# Patient Record
Sex: Female | Born: 1965 | Race: Black or African American | Hispanic: No | Marital: Married | Smoking: Never smoker
Health system: Southern US, Community
[De-identification: ages and names within clinical notes are randomized; demographics above are authoritative.]

## PROBLEM LIST (undated history)

## (undated) DIAGNOSIS — H6690 Otitis media, unspecified, unspecified ear: Secondary | ICD-10-CM

## (undated) DIAGNOSIS — F419 Anxiety disorder, unspecified: Secondary | ICD-10-CM

## (undated) DIAGNOSIS — R51 Headache: Secondary | ICD-10-CM

## (undated) DIAGNOSIS — E079 Disorder of thyroid, unspecified: Secondary | ICD-10-CM

## (undated) DIAGNOSIS — F410 Panic disorder [episodic paroxysmal anxiety] without agoraphobia: Secondary | ICD-10-CM

## (undated) DIAGNOSIS — N390 Urinary tract infection, site not specified: Secondary | ICD-10-CM

## (undated) DIAGNOSIS — E119 Type 2 diabetes mellitus without complications: Secondary | ICD-10-CM

## (undated) DIAGNOSIS — R519 Headache, unspecified: Secondary | ICD-10-CM

## (undated) HISTORY — PX: ABDOMINAL HYSTERECTOMY: SHX81

## (undated) HISTORY — PX: CHOLECYSTECTOMY: SHX55

## (undated) HISTORY — PX: OTHER SURGICAL HISTORY: SHX169

## (undated) HISTORY — PX: TUBAL LIGATION: SHX77

---

## 2004-08-22 HISTORY — PX: ABDOMINAL HYSTERECTOMY: SHX81

## 2008-11-24 DIAGNOSIS — J45909 Unspecified asthma, uncomplicated: Secondary | ICD-10-CM | POA: Insufficient documentation

## 2010-05-31 DIAGNOSIS — R002 Palpitations: Secondary | ICD-10-CM | POA: Insufficient documentation

## 2010-07-08 DIAGNOSIS — R109 Unspecified abdominal pain: Secondary | ICD-10-CM | POA: Insufficient documentation

## 2010-09-20 ENCOUNTER — Ambulatory Visit
Admission: RE | Admit: 2010-09-20 | Discharge: 2010-09-20 | Payer: Self-pay | Source: Home / Self Care | Admitting: Family Medicine

## 2010-09-29 NOTE — Assessment & Plan Note (Signed)
Summary: FEVER,SINUS PROBLEMS,CHEST TIGHTNESS,SOB,COUGH/WSE (rm 4)   Vital Signs:  Patient Profile:   45 Years Old Female CC:      sinus pressure and pain, cough, epistaxis, right chest discomfort Height:     63 inches Weight:      205 pounds O2 Sat:      99 % O2 treatment:    Room Air Temp:     98.3 degrees F oral Pulse rate:   76 / minute Resp:     16 per minute BP sitting:   126 / 84  (left arm) Cuff size:   large  Vitals Entered By: Lajean Saver RN (September 20, 2010 7:08 PM)                  Updated Prior Medication List: LEVOTHYROXINE SODIUM 175 MCG TABS (LEVOTHYROXINE SODIUM) once daily LORAZEPAM 0.5 MG TABS (LORAZEPAM) prn SERTRALINE HCL 50 MG TABS (SERTRALINE HCL)   Current Allergies: ! ELAVIL ! * LANSOPRAZOLE ! * MILKHistory of Present Illness Chief Complaint: sinus pressure and pain, cough, epistaxis, right chest discomfort History of Present Illness:  Subjective: Patient complains of onset URI symptoms 3 days ago with a sore throat (now resolved) followed by hoarseness and nasal congestion.  She had a sinusitis about two months ago and started taking Septra DS that she has left over.  She has a history of sinusitis about 4 times per year.  She has had intermittent right epistaxis. + mild cough today. No pleuritic pain No wheezing + post-nasal drainage + right side sinus pain/pressure No itchy/red eyes No earache but ears feel clogged No hemoptysis No SOB No fever, + chills No nausea No vomiting No abdominal pain No diarrhea No skin rashes + fatigue + myalgias + headache Used OTC meds without relief   REVIEW OF SYSTEMS Constitutional Symptoms      Denies fever, chills, night sweats, weight loss, weight gain, and fatigue.  Eyes       Denies change in vision, eye pain, eye discharge, glasses, contact lenses, and eye surgery. Ear/Nose/Throat/Mouth       Complains of frequent runny nose, frequent nose bleeds, sinus problems, and hoarseness.       Denies hearing loss/aids, change in hearing, ear pain, ear discharge, dizziness, sore throat, and tooth pain or bleeding.  Respiratory       Complains of productive cough.      Denies dry cough, wheezing, shortness of breath, asthma, bronchitis, and emphysema/COPD.  Cardiovascular       Complains of chest pain.      Denies murmurs and tires easily with exhertion.      Comments: right side   Gastrointestinal       Denies stomach pain, nausea/vomiting, diarrhea, constipation, blood in bowel movements, and indigestion. Genitourniary       Denies painful urination, kidney stones, and loss of urinary control. Neurological       Complains of headaches.      Denies paralysis, seizures, and fainting/blackouts. Musculoskeletal       Denies muscle pain, joint pain, joint stiffness, decreased range of motion, redness, swelling, muscle weakness, and gout.  Skin       Denies bruising, unusual mles/lumps or sores, and hair/skin or nail changes.  Psych       Denies mood changes, temper/anger issues, anxiety/stress, speech problems, depression, and sleep problems. Other Comments: X3days patient has had sinus pain and pressure, hoarseness, eye pain. 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  Extraocular movement is intact.  Conjunctivae are not inflamed.  Ears:  Canals normal.  Tympanic membranes normal.   Nose:  Congested turbinates.  No evidence epistaxis.  Right maxillary sinus tenderness present. Pharynx:  Normal  Neck:  Supple.   Slightly tender shotty anterior/posterior nodes are palpated bilaterally.  Lungs:  Clear to auscultation.  Breath sounds are equal.  Heart:  Regular rate and rhythm without murmurs, rubs, or gallops.  Abdomen:  Nontender without masses or hepatosplenomegaly.  Bowel sounds are present.  No CVA or flank tenderness.  Extremities:  No edema.   Skin:  No rash Assessment New Problems: ACUTE MAXILLARY SINUSITIS (ICD-461.0) UPPER RESPIRATORY INFECTION, ACUTE (ICD-465.9)   Plan New Medications/Changes: BENZONATATE 200 MG CAPS (BENZONATATE) One by mouth hs as needed cough  #12 x 0, 09/20/2010, Donna Christen MD FLUTICASONE PROPIONATE 50 MCG/ACT SUSP (FLUTICASONE PROPIONATE) 2 sprays in each nostril once daily  #One x 0, 09/20/2010, Donna Christen MD  New Orders: New Patient Level III [phone] Planning Comments:   Continue Septra DS for 7 to 10 days, expectorant/ decongestant, fluticasone nasal spray, cough suppressant at bedtime.  Increase fluid intake Followup with PCP if not improving 7 to 10 days   The patient and/or caregiver has been counseled thoroughly with regard to medications prescribed including dosage, schedule, interactions, rationale for use, and possible side effects and they verbalize understanding.  Diagnoses and expected course of recovery discussed and will return if not improved as expected or if the condition worsens. Patient and/or caregiver verbalized understanding.  Prescriptions: BENZONATATE 200 MG CAPS (BENZONATATE) One by mouth hs as needed cough  #12 x 0   Entered and Authorized by:   Donna Christen MD   Signed by:   Donna Christen MD on 09/20/2010   Method used:   Print then Give to Patient   RxID:   [id number] FLUTICASONE PROPIONATE 50 MCG/ACT SUSP (FLUTICASONE PROPIONATE) 2 sprays in each nostril once daily  #One x 0   Entered and Authorized by:   Donna Christen MD   Signed by:   Donna Christen MD on 09/20/2010   Method used:   Print then Give to Patient   RxID:    [id number]   Patient Instructions: 1)  Take Mucinex D (guaifenesin with decongestant) twice daily for congestion. 2)  Increase fluid intake, rest. 3)  May use Afrin nasal spray (or generic oxymetazoline) twice daily for about 5 days.  (Use the Afrin about 15 minutes before using prescription nose spray).  Also recommend using saline nasal spray several times daily and/or saline nasal irrigation. 4)  Finish antibiotic.   May use Vicks at night. 5)  Followup with family doctor if not improving 7 to 10 days.   Orders Added: 1)  New Patient Level III [13244]

## 2010-11-11 DIAGNOSIS — F411 Generalized anxiety disorder: Secondary | ICD-10-CM | POA: Insufficient documentation

## 2010-11-11 DIAGNOSIS — E039 Hypothyroidism, unspecified: Secondary | ICD-10-CM | POA: Insufficient documentation

## 2010-11-16 DIAGNOSIS — E559 Vitamin D deficiency, unspecified: Secondary | ICD-10-CM | POA: Insufficient documentation

## 2011-12-29 ENCOUNTER — Observation Stay (HOSPITAL_COMMUNITY): Payer: Self-pay

## 2011-12-29 ENCOUNTER — Observation Stay (HOSPITAL_COMMUNITY)
Admission: EM | Admit: 2011-12-29 | Discharge: 2011-12-29 | Disposition: A | Discharge status: Home / Self Care | Payer: Self-pay | Source: Ambulatory Visit | Attending: Emergency Medicine | Admitting: Emergency Medicine

## 2011-12-29 DIAGNOSIS — R209 Unspecified disturbances of skin sensation: Secondary | ICD-10-CM | POA: Insufficient documentation

## 2011-12-29 DIAGNOSIS — M519 Unspecified thoracic, thoracolumbar and lumbosacral intervertebral disc disorder: Secondary | ICD-10-CM

## 2011-12-29 DIAGNOSIS — R609 Edema, unspecified: Secondary | ICD-10-CM | POA: Insufficient documentation

## 2011-12-29 DIAGNOSIS — M25559 Pain in unspecified hip: Principal | ICD-10-CM | POA: Insufficient documentation

## 2011-12-29 DIAGNOSIS — M5126 Other intervertebral disc displacement, lumbar region: Secondary | ICD-10-CM | POA: Insufficient documentation

## 2011-12-29 DIAGNOSIS — M79609 Pain in unspecified limb: Secondary | ICD-10-CM | POA: Insufficient documentation

## 2011-12-29 LAB — BASIC METABOLIC PANEL
CO2: 28 mEq/L (ref 19–32)
Chloride: 100 mEq/L (ref 96–112)
Creatinine, Ser: 0.67 mg/dL (ref 0.50–1.10)
Potassium: 3.8 mEq/L (ref 3.5–5.1)

## 2011-12-29 LAB — CK: Total CK: 79 U/L (ref 7–177)

## 2011-12-29 MED ORDER — ACETAMINOPHEN 325 MG PO TABS
650.0000 mg | ORAL_TABLET | Freq: Once | ORAL | Status: AC
  Administered 2011-12-29: 650 mg via ORAL
  Filled 2011-12-29: qty 2

## 2011-12-29 NOTE — ED Notes (Signed)
Pt c/o right leg pain and left hip pain. Reporting right leg feels heavier than left, reporting swelling, although possible trace edema noted to right ankle. Pt describes leg pain as "shooting", pain extends from top of thigh to ankle. Pt ambulated to room without difficulty. Pt reports pain x 3 weeks. No injury noted. Skin intact. A & O x 4.

## 2011-12-29 NOTE — ED Notes (Signed)
Patient transported to MRI 

## 2011-12-29 NOTE — Discharge Instructions (Signed)
Degenerative Disc Disease Degenerative disc disease is a condition caused by the changes that occur in the cushions of the backbone (spinal discs) as you grow older. Spinal discs are soft and compressible discs located between the bones of the spine (vertebrae). They act like shock absorbers. Degenerative disc disease can affect the wholespine. However, the neck and lower back are most commonly affected. Many changes can occur in the spinal discs with aging, such as:  The spinal discs may dry and shrink.   Small tears may occur in the tough, outer covering of the disc (annulus).   The disc space may become smaller due to loss of water.   Abnormal growths in the bone (spurs) may occur. This can put pressure on the nerve roots exiting the spinal canal, causing pain.   The spinal canal may become narrowed.  CAUSES  Degenerative disc disease is a condition caused by the changes that occur in the spinal discs with aging. The exact cause is not known, but there is a genetic basis for many patients. Degenerative changes can occur due to loss of fluid in the disc. This makes the disc thinner and reduces the space between the backbones. Small cracks can develop in the outer layer of the disc. This can lead to the breakdown of the disc. You are more likely to get degenerative disc disease if you are overweight. Smoking cigarettes and doing heavy work such as weightlifting can also increase your risk of this condition. Degenerative changes can start after a sudden injury. Growth of bone spurs can compress the nerve roots and cause pain.  SYMPTOMS  The symptoms vary from person to person. Some people may have no pain, while others have severe pain. The pain may be so severe that it can limit your activities. The location of the pain depends on the part of your backbone that is affected. You will have neck or arm pain if a disc in the neck area is affected. You will have pain in your back, buttocks, or legs if a  disc in the lower back is affected. The pain becomes worse while bending, reaching up, or with twisting movements. The pain may start gradually and then get worse as time passes. It may also start after a major or minor injury. You may feel numbness or tingling in the arms or legs.  DIAGNOSIS  Your caregiver will ask you about your symptoms and about activities or habits that may cause the pain. He or she may also ask about any injuries, diseases, ortreatments you have had earlier. Your caregiver will examine you to check for the range of movement that is possible in the affected area, to check for strength in your extremities, and to check for sensation in the areas of the arms and legs supplied by different nerve roots. An X-ray of the spine may be taken. Your caregiver may suggest other imaging tests, such as a computerized magnetic scan (MRI), if needed.  TREATMENT  Treatment includes rest, modifying your activities, and applying ice and heat. Your caregiver may prescribe medicines to reduce your pain and may ask you to do some exercises to strengthen your back. In some cases, you may need surgery. You and your caregiver will decide on the treatment that is best for you. HOME CARE INSTRUCTIONS   Follow proper lifting and walking techniques as advised by your caregiver.   Maintain good posture.   Exercise regularly as advised.   Perform relaxation exercises.   Change your sitting,   standing, and sleeping habits as advised. Change positions frequently.   Lose weight as advised.   Stop smoking if you smoke.   Wear supportive footwear.  SEEK MEDICAL CARE IF:  The pain does not go away within 1 to 4 weeks. SEEK IMMEDIATE MEDICAL CARE IF:   The pain is severe.   You notice weakness in your arms, hands, or legs.   You begin to lose control of your bladder or bowel.  MAKE SURE YOU:   Understand these instructions.   Will watch your condition.   Will get help right away if you are not  doing well or get worse.  Document Released: 06/05/2007 Document Revised: 07/28/2011 Document Reviewed: 06/05/2007 [employer] Patient Information 2012 [employer]. 

## 2011-12-29 NOTE — ED Notes (Signed)
Pt complains of left leg and hip pain

## 2011-12-29 NOTE — ED Provider Notes (Signed)
History   This chart was scribed for Forbes Cellar, MD by Melba Coon. The patient was seen in room CDU03/CDU03 and the patient's care was started at 2:13PM.    CSN: [id number]  Arrival date & time 12/29/11  1317   None     Chief Complaint  Patient presents with  . Leg Pain  . Hip Pain  . Extremity Weakness    (Consider location/radiation/quality/duration/timing/severity/associated sxs/prior treatment) HPI Alexis Holt is a 46 y.o. female who presents to the Emergency Department complaining of constant, moderate to severe left hip and right leg pain with an onset 3 weeks ago. Pt used to have pains in both hip but now it is just the left hip. Tingling and numbness present in top of right leg and radiates to the ankle all the time; when she sleeps or sits down, it is worse. Leaning towards the left thip aggravates the pain. Pt has had this pain before; she is vitamin D deficient; pt takes vitamin D supplement. Pt states that she has right thigh edema present. Pt is still able to ambulate. Weakness in lower extremities present. No HA, fever, neck pain, sore throat, rash, back pain, CP, SOB, abd pain, n/v/d, dysuria. Hx of thyroid disease; takes levothyroxine. No family Hx of blood clots. PERC negative. No known allergies. No other pertinent medical symptoms.  No past medical history on file.  No past surgical history on file.  No family history on file.  History  Substance Use Topics  . Smoking status: Not on file  . Smokeless tobacco: Not on file  . Alcohol Use: Not on file    OB History    No data available      Review of Systems 10 Systems reviewed and all are negative for acute change except as noted in the HPI.   Allergies  Amitriptyline hcl; Pseudoephedrine; Sertraline; and Milk-related compounds  Home Medications   Current Outpatient Rx  Name Route Sig Dispense Refill  . VITAMIN D-3 5000 UNITS PO TABS Oral Take 5,000 Units by mouth daily.    Marland Kitchen LEVOTHYROXINE  SODIUM 150 MCG PO TABS Oral Take 150 mcg by mouth daily.    Marland Kitchen LORAZEPAM 1 MG PO TABS Oral Take 1 mg by mouth daily as needed. For anxiety    . THERA M PLUS PO TABS Oral Take 1 tablet by mouth daily.      BP 137/65  Pulse 100  Temp(Src) 98.1 F (36.7 C) (Oral)  Resp 18  SpO2 99%  Physical Exam  Nursing note and vitals reviewed. Constitutional: She is oriented to person, place, and time. She appears well-developed and well-nourished. No distress.  HENT:  Head: Normocephalic and atraumatic.  Eyes: EOM are normal. Pupils are equal, round, and reactive to light.  Neck: Normal range of motion. Neck supple. No tracheal deviation present.  Cardiovascular: Normal rate, regular rhythm and normal heart sounds.  Exam reveals no gallop and no friction rub.   No murmur heard. Pulmonary/Chest: Effort normal. No respiratory distress. She has no wheezes. She has no rales.  Abdominal: Soft. There is no tenderness.  Musculoskeletal: Normal range of motion. She exhibits edema (Trace pitting edema in bilateral lower extremities) and tenderness (Diffuse TTP anterior thigh).  Neurological: She is alert and oriented to person, place, and time.       4+/5 strength in rt hip flexors; 5/5 strength throughout LLE  Skin: Skin is warm and dry. No rash noted.  Psychiatric: She has a normal mood and affect.  Her behavior is normal.    ED Course  Procedures (including critical care time)  DIAGNOSTIC STUDIES: Oxygen Saturation is 99% on room air, normal by my interpretation.    COORDINATION OF CARE:  2:20PM - EDMD will order back MRI, blood w/u, lower extremities XRs and pain meds for the pt.    Labs Reviewed  BASIC METABOLIC PANEL  CK   No results found.   No diagnosis found.    MDM  Pt with multiple complaints including left hip pain, RLE weakness/numbness/tingling/edema. Left hip exam is benign. Weight bearing without difficulty. She does have objective leg weakness, min ttp. CK ordered r/o  myositis. MRI lumbar spine given weakness and paresthesias. I have very low susp for DVT. Declining pain medication at this time. Moved to CDU. D/W Felicie Morn, PA who will follow up MRI lumbar spine and dispo accordingly.  I personally performed the services described in this documentation, which was scribed in my presence. The recorded information has been reviewed and considered.        Forbes Cellar, MD 12/29/11 [phone]

## 2011-12-29 NOTE — ED Notes (Signed)
Onset 3 weeks ago pain left and right hip radiating to bilateral lower extremities currently pain left hip pain no radiating pain 5/10 achy sharp movement and rest no pain. Denies right hip pain however right lower extremity edema trace pedal pulse +2 bilateral and states right lower extremity feels heavier than left. Skin warm airway intact bilateral equal chest rise and fall. Ax4.

## 2012-01-06 ENCOUNTER — Encounter (HOSPITAL_COMMUNITY): Payer: Self-pay | Admitting: *Deleted

## 2012-01-06 ENCOUNTER — Emergency Department (HOSPITAL_COMMUNITY)
Admission: EM | Admit: 2012-01-06 | Discharge: 2012-01-07 | Disposition: A | Discharge status: Home / Self Care | Payer: Self-pay | Attending: Emergency Medicine | Admitting: Emergency Medicine

## 2012-01-06 DIAGNOSIS — R110 Nausea: Secondary | ICD-10-CM | POA: Insufficient documentation

## 2012-01-06 DIAGNOSIS — R109 Unspecified abdominal pain: Secondary | ICD-10-CM | POA: Insufficient documentation

## 2012-01-06 DIAGNOSIS — N390 Urinary tract infection, site not specified: Secondary | ICD-10-CM | POA: Insufficient documentation

## 2012-01-06 DIAGNOSIS — Z79899 Other long term (current) drug therapy: Secondary | ICD-10-CM | POA: Insufficient documentation

## 2012-01-06 LAB — POCT PREGNANCY, URINE: Preg Test, Ur: NEGATIVE

## 2012-01-06 NOTE — ED Notes (Signed)
Pt states that she was having abdominal pain since yesterday with some slight burning in her epigastric region as well. pt states that today she noticed cloudy and pressure after urination with frequent urination.

## 2012-01-07 LAB — URINALYSIS, ROUTINE W REFLEX MICROSCOPIC
Bilirubin Urine: NEGATIVE
Nitrite: NEGATIVE
Specific Gravity, Urine: 1.022 (ref 1.005–1.030)
Urobilinogen, UA: 0.2 mg/dL (ref 0.0–1.0)
pH: 5.5 (ref 5.0–8.0)

## 2012-01-07 LAB — CBC
HCT: 35.5 % — ABNORMAL LOW (ref 36.0–46.0)
MCH: 28.6 pg (ref 26.0–34.0)
MCHC: 34.4 g/dL (ref 30.0–36.0)
MCV: 83.3 fL (ref 78.0–100.0)
Platelets: 260 10*3/uL (ref 150–400)
RDW: 13 % (ref 11.5–15.5)
WBC: 10.5 10*3/uL (ref 4.0–10.5)

## 2012-01-07 LAB — DIFFERENTIAL
Basophils Absolute: 0 10*3/uL (ref 0.0–0.1)
Eosinophils Absolute: 0.2 10*3/uL (ref 0.0–0.7)
Eosinophils Relative: 2 % (ref 0–5)
Monocytes Absolute: 0.9 10*3/uL (ref 0.1–1.0)

## 2012-01-07 LAB — COMPREHENSIVE METABOLIC PANEL
ALT: 13 U/L (ref 0–35)
AST: 16 U/L (ref 0–37)
CO2: 24 mEq/L (ref 19–32)
Calcium: 8.9 mg/dL (ref 8.4–10.5)
Creatinine, Ser: 0.71 mg/dL (ref 0.50–1.10)
GFR calc Af Amer: >90 mL/min (ref >90)
GFR calc non Af Amer: >90 mL/min (ref >90)
Sodium: 136 mEq/L (ref 135–145)
Total Protein: 7 g/dL (ref 6.0–8.3)

## 2012-01-07 LAB — URINE MICROSCOPIC-ADD ON

## 2012-01-07 MED ORDER — CIPROFLOXACIN HCL 500 MG PO TABS: 500.0000 mg | ORAL_TABLET | Freq: Two times a day (BID) | ORAL | Status: AC

## 2012-01-07 MED ORDER — HYDROCODONE-ACETAMINOPHEN 5-500 MG PO TABS: 1.0000 | ORAL_TABLET | Freq: Four times a day (QID) | ORAL | Status: AC | PRN

## 2012-01-07 MED ORDER — GI COCKTAIL ~~LOC~~
30.0000 mL | Freq: Once | ORAL | Status: AC
  Administered 2012-01-07: 30 mL via ORAL
  Filled 2012-01-07: qty 30

## 2012-01-07 NOTE — Discharge Instructions (Signed)
Abdominal Pain Abdominal pain can be caused by many things. Your caregiver decides the seriousness of your pain by an examination and possibly blood tests and X-rays. Many cases can be observed and treated at home. Most abdominal pain is not caused by a disease and will probably improve without treatment. However, in many cases, more time must pass before a clear cause of the pain can be found. Before that point, it may not be known if you need more testing, or if hospitalization or surgery is needed. HOME CARE INSTRUCTIONS   Do not take laxatives unless directed by your caregiver.   Take pain medicine only as directed by your caregiver.   Only take over-the-counter or prescription medicines for pain, discomfort, or fever as directed by your caregiver.   Try a clear liquid diet (broth, tea, or water) for as long as directed by your caregiver. Slowly move to a bland diet as tolerated.  SEEK IMMEDIATE MEDICAL CARE IF:   The pain does not go away.   You have a fever.   You keep throwing up (vomiting).   The pain is felt only in portions of the abdomen. Pain in the right side could possibly be appendicitis. In an adult, pain in the left lower portion of the abdomen could be colitis or diverticulitis.   You pass bloody or black tarry stools.  MAKE SURE YOU:   Understand these instructions.   Will watch your condition.   Will get help right away if you are not doing well or get worse.  Document Released: 05/18/2005 Document Revised: 07/28/2011 Document Reviewed: 03/26/2008 [facility] Patient Information 2012 [place], [place].Urinary Tract Infection A urinary tract infection (UTI) is often caused by a germ (bacteria). A UTI is usually helped with medicine (antibiotics) that kills germs. Take all the medicine until it is gone. Do this even if you are feeling better. You are usually better in 7 to 10 days. HOME CARE   Drink enough water and fluids to keep your pee (urine) clear or pale yellow.  Drink:   Cranberry juice.   Water.   Avoid:   Caffeine.   Tea.   Bubbly (carbonated) drinks.   Alcohol.   Only take medicine as told by your doctor.   To prevent further infections:   Pee often.   After pooping (bowel movement), women should wipe from front to back. Use each tissue only once.   Pee before and after having sex (intercourse).  Ask your doctor when your test results will be ready. Make sure you follow up and get your test results.  GET HELP RIGHT AWAY IF:   There is very bad back pain or lower belly (abdominal) pain.   You get the chills.   You have a fever.   Your baby is older than 3 months with a rectal temperature of 102 F (38.9 C) or higher.   Your baby is 51 months old or younger with a rectal temperature of 100.4 F (38 C) or higher.   You feel sick to your stomach (nauseous) or throw up (vomit).   There is continued burning with peeing.   Your problems are not better in 3 days. Return sooner if you are getting worse.  MAKE SURE YOU:   Understand these instructions.   Will watch your condition.   Will get help right away if you are not doing well or get worse.  Document Released: 01/25/2008 Document Revised: 07/28/2011 Document Reviewed: 01/25/2008 [facility] Patient Information 2012 [place], [place].

## 2012-01-07 NOTE — ED Provider Notes (Signed)
History     CSN: [id number]  Arrival date & time 01/06/12  2314   First MD Initiated Contact with Patient 01/06/12 2351      Chief Complaint  Patient presents with  . Abdominal Pain    (Consider location/radiation/quality/duration/timing/severity/associated sxs/prior treatment) HPI Comments: History of lap chole in the past.  Patient is a 46 y.o. female presenting with abdominal pain. The history is provided by the patient.  Abdominal Pain The primary symptoms of the illness include abdominal pain, nausea and dysuria. The primary symptoms of the illness do not include fever, vomiting or diarrhea. The onset of the illness was gradual. The problem has been gradually worsening.  The patient states that she believes she is currently not pregnant. The patient has not had a change in bowel habit. Symptoms associated with the illness do not include constipation or back pain.    History reviewed. No pertinent past medical history.  Past Surgical History  Procedure Date  . Thyriod     surgery  . Cholecystectomy   . Tubal ligation     History reviewed. No pertinent family history.  History  Substance Use Topics  . Smoking status: Never Smoker   . Smokeless tobacco: Not on file  . Alcohol Use: No    OB History    Grav Para Term Preterm Abortions TAB SAB Ect Mult Living                  Review of Systems  Constitutional: Negative for fever.  Gastrointestinal: Positive for nausea and abdominal pain. Negative for vomiting, diarrhea and constipation.  Genitourinary: Positive for dysuria.  Musculoskeletal: Negative for back pain.  All other systems reviewed and are negative.    Allergies  Amitriptyline hcl; Pseudoephedrine; Sertraline; and Milk-related compounds  Home Medications   Current Outpatient Rx  Name Route Sig Dispense Refill  . AL HYD-MG TR-ALG AC-SOD BICARB PO CHEW Oral Chew 1-2 tablets by mouth 3 (three) times daily as needed. For gas pain    . VITAMIN D-3  5000 UNITS PO TABS Oral Take 5,000 Units by mouth daily.    . IBUPROFEN 200 MG PO TABS Oral Take 200 mg by mouth every 6 (six) hours as needed. For inflammation    . LEVOTHYROXINE SODIUM 150 MCG PO TABS Oral Take 150 mcg by mouth daily.    Marland Kitchen LORAZEPAM 1 MG PO TABS Oral Take 0.5-1 mg by mouth daily as needed. For anxiety    . THERA M PLUS PO TABS Oral Take 1 tablet by mouth daily.      BP 135/71  Pulse 84  Temp(Src) 98.6 F (37 C) (Oral)  Resp 16  SpO2 97%  Physical Exam  Nursing note and vitals reviewed. Constitutional: She is oriented to person, place, and time. She appears well-developed and well-nourished. No distress.  HENT:  Head: Normocephalic and atraumatic.  Neck: Normal range of motion. Neck supple.  Cardiovascular: Normal rate and regular rhythm.   No murmur heard. Pulmonary/Chest: Effort normal and breath sounds normal. No respiratory distress.  Abdominal: Soft. Bowel sounds are normal. She exhibits no distension. There is no tenderness.  Musculoskeletal: Normal range of motion. She exhibits no edema.  Neurological: She is alert and oriented to person, place, and time.  Skin: Skin is warm and dry. She is not diaphoretic.    ED Course  Procedures (including critical care time)  Labs Reviewed  URINALYSIS, ROUTINE W REFLEX MICROSCOPIC - Abnormal; Notable for the following:    APPearance  CLOUDY (*)    Hgb urine dipstick LARGE (*)    Ketones, ur 15 (*)    Protein, ur 100 (*)    Leukocytes, UA MODERATE (*)    All other components within normal limits  URINE MICROSCOPIC-ADD ON - Abnormal; Notable for the following:    Bacteria, UA FEW (*)    All other components within normal limits  POCT PREGNANCY, URINE  CBC  DIFFERENTIAL  COMPREHENSIVE METABOLIC PANEL  LIPASE, BLOOD   No results found.   No diagnosis found.    MDM  The labs look okay, however the urine shows a uti.  Will treat with cipro, pain meds.  To return prn if worsens.        Geoffery Lyons, MD 01/07/12 0111

## 2012-01-26 ENCOUNTER — Encounter (HOSPITAL_COMMUNITY): Payer: Self-pay | Admitting: Emergency Medicine

## 2012-01-26 ENCOUNTER — Emergency Department (INDEPENDENT_AMBULATORY_CARE_PROVIDER_SITE_OTHER)
Admission: EM | Admit: 2012-01-26 | Discharge: 2012-01-26 | Disposition: A | Discharge status: Home / Self Care | Payer: Self-pay | Source: Home / Self Care | Attending: Emergency Medicine | Admitting: Emergency Medicine

## 2012-01-26 DIAGNOSIS — F411 Generalized anxiety disorder: Secondary | ICD-10-CM

## 2012-01-26 DIAGNOSIS — Z760 Encounter for issue of repeat prescription: Secondary | ICD-10-CM

## 2012-01-26 DIAGNOSIS — E039 Hypothyroidism, unspecified: Secondary | ICD-10-CM

## 2012-01-26 DIAGNOSIS — F419 Anxiety disorder, unspecified: Secondary | ICD-10-CM

## 2012-01-26 HISTORY — DX: Disorder of thyroid, unspecified: E07.9

## 2012-01-26 HISTORY — DX: Anxiety disorder, unspecified: F41.9

## 2012-01-26 HISTORY — DX: Panic disorder (episodic paroxysmal anxiety): F41.0

## 2012-01-26 LAB — TSH: TSH: 0.019 u[IU]/mL — ABNORMAL LOW (ref 0.350–4.500)

## 2012-01-26 LAB — POCT I-STAT, CHEM 8
BUN: 8 mg/dL (ref 6–23)
Creatinine, Ser: 0.6 mg/dL (ref 0.50–1.10)
Hemoglobin: 13.3 g/dL (ref 12.0–15.0)
Potassium: 3.5 mEq/L (ref 3.5–5.1)
Sodium: 141 mEq/L (ref 135–145)
TCO2: 27 mmol/L (ref 0–100)

## 2012-01-26 MED ORDER — BUSPIRONE HCL 10 MG PO TABS: 10.0000 mg | ORAL_TABLET | Freq: Two times a day (BID) | ORAL | Status: DC

## 2012-01-26 MED ORDER — LEVOTHYROXINE SODIUM 175 MCG PO TABS: ORAL_TABLET | ORAL | Status: DC

## 2012-01-26 NOTE — Discharge Instructions (Signed)
Anxiety and Panic Attacks Your caregiver has informed you that you are having an anxiety or panic attack. There may be many forms of this. Most of the time these attacks come suddenly and without warning. They come at any time of day, including periods of sleep, and at any time of life. They may be strong and unexplained. Although panic attacks are very scary, they are physically harmless. Sometimes the cause of your anxiety is not known. Anxiety is a protective mechanism of the body in its fight or flight mechanism. Most of these perceived danger situations are actually nonphysical situations (such as anxiety over losing a job). CAUSES  The causes of an anxiety or panic attack are many. Panic attacks may occur in otherwise healthy people given a certain set of circumstances. There may be a genetic cause for panic attacks. Some medications may also have anxiety as a side effect. SYMPTOMS  Some of the most common feelings are:  Intense terror.   Dizziness, feeling faint.   Hot and cold flashes.   Fear of going crazy.   Feelings that nothing is real.   Sweating.   Shaking.   Chest pain or a fast heartbeat (palpitations).   Smothering, choking sensations.   Feelings of impending doom and that death is near.   Tingling of extremities, this may be from over-breathing.   Altered reality (derealization).   Being detached from yourself (depersonalization).  Several symptoms can be present to make up anxiety or panic attacks. DIAGNOSIS  The evaluation by your caregiver will depend on the type of symptoms you are experiencing. The diagnosis of anxiety or panic attack is made when no physical illness can be determined to be a cause of the symptoms. TREATMENT  Treatment to prevent anxiety and panic attacks may include:  Avoidance of circumstances that cause anxiety.   Reassurance and relaxation.   Regular exercise.   Relaxation therapies, such as yoga.   Psychotherapy with a  psychiatrist or therapist.   Avoidance of caffeine, alcohol and illegal drugs.   Prescribed medication.  SEEK IMMEDIATE MEDICAL CARE IF:   You experience panic attack symptoms that are different than your usual symptoms.   You have any worsening or concerning symptoms.  Document Released: 08/08/2005 Document Revised: 07/28/2011 Document Reviewed: 12/10/2009 [facility] Patient Information 2012 [place], [place].Hypothyroidism The thyroid is a large gland located in the lower front of your neck. The thyroid gland helps control metabolism. Metabolism is how your body handles food. It controls metabolism with the hormone thyroxine. When this gland is underactive (hypothyroid), it produces too little hormone.  CAUSES These include:   Absence or destruction of thyroid tissue.   Goiter due to iodine deficiency.   Goiter due to medications.   Congenital defects (since birth).   Problems with the pituitary. This causes a lack of TSH (thyroid stimulating hormone). This hormone tells the thyroid to turn out more hormone.  SYMPTOMS  Lethargy (feeling as though you have no energy)   Cold intolerance   Weight gain (in spite of normal food intake)   Dry skin   Coarse hair   Menstrual irregularity (if severe, may lead to infertility)   Slowing of thought processes  Cardiac problems are also caused by insufficient amounts of thyroid hormone. Hypothyroidism in the newborn is cretinism, and is an extreme form. It is important that this form be treated adequately and immediately or it will lead rapidly to retarded physical and mental development. DIAGNOSIS  To prove hypothyroidism, your caregiver may  do blood tests and ultrasound tests. Sometimes the signs are hidden. It may be necessary for your caregiver to watch this illness with blood tests either before or after diagnosis and treatment. TREATMENT  Low levels of thyroid hormone are increased by using synthetic thyroid hormone. This is a safe,  effective treatment. It usually takes about four weeks to gain the full effects of the medication. After you have the full effect of the medication, it will generally take another four weeks for problems to leave. Your caregiver may start you on low doses. If you have had heart problems the dose may be gradually increased. It is generally not an emergency to get rapidly to normal. HOME CARE INSTRUCTIONS   Take your medications as your caregiver suggests. Let your caregiver know of any medications you are taking or start taking. Your caregiver will help you with dosage schedules.   As your condition improves, your dosage needs may increase. It will be necessary to have continuing blood tests as suggested by your caregiver.   Report all suspected medication side effects to your caregiver.  SEEK MEDICAL CARE IF: Seek medical care if you develop:  Sweating.   Tremulousness (tremors).   Anxiety.   Rapid weight loss.   Heat intolerance.   Emotional swings.   Diarrhea.   Weakness.  SEEK IMMEDIATE MEDICAL CARE IF:  You develop chest pain, an irregular heart beat (palpitations), or a rapid heart beat. MAKE SURE YOU:   Understand these instructions.   Will watch your condition.   Will get help right away if you are not doing well or get worse.  Document Released: 08/08/2005 Document Revised: 07/28/2011 Document Reviewed: 03/28/2008 [facility] Patient Information 2012 [place], [place].

## 2012-01-26 NOTE — ED Provider Notes (Addendum)
History     CSN: [id number]  Arrival date & time 01/26/12  1108   First MD Initiated Contact with Patient 01/26/12 1108      Chief Complaint  Patient presents with  . Medication Refill    (Consider location/radiation/quality/duration/timing/severity/associated sxs/prior treatment) HPI Comments: Presents urgent care today describing that her PCP in [place] is unable to refill her thyroid prescription as she has not been able to go back to have labs redrawn. She has moved back to [place] within the last 2 or 3 months and is requesting a medication refill for her thyroid. She also seemed to be experiencing anxiety related symptoms as she has been going through a lot of family issues and stressors. She describes it in the past she has been treated for anxiety and panic attacks. Will like [place] to help her, with these symptoms.   Past Medical History  Diagnosis Date  . Anxiety   . Panic attacks   . Thyroid disease     Past Surgical History  Procedure Date  . Thyriod     surgery  . Cholecystectomy   . Tubal ligation   . Abdominal hysterectomy     No family history on file.  History  Substance Use Topics  . Smoking status: Never Smoker   . Smokeless tobacco: Not on file  . Alcohol Use: No    OB History    Grav Para Term Preterm Abortions TAB SAB Ect Mult Living                  Review of Systems  Constitutional: Positive for appetite change. Negative for fever, chills, fatigue and unexpected weight change.  Neurological: Negative for dizziness.    Allergies  Amitriptyline hcl; Pseudoephedrine; Sertraline; and Milk-related compounds  Home Medications   Current Outpatient Rx  Name Route Sig Dispense Refill  . VITAMIN D-3 5000 UNITS PO TABS Oral Take 5,000 Units by mouth daily.    Marland Kitchen LEVOTHYROXINE SODIUM 150 MCG PO TABS Oral Take 150 mcg by mouth daily.    Marland Kitchen LORAZEPAM 1 MG PO TABS Oral Take 0.5-1 mg by mouth daily as needed. For anxiety    . THERA M PLUS PO TABS  Oral Take 1 tablet by mouth daily.    . AL HYD-MG TR-ALG AC-SOD BICARB PO CHEW Oral Chew 1-2 tablets by mouth 3 (three) times daily as needed. For gas pain    . BUSPIRONE HCL 10 MG PO TABS Oral Take 1 tablet (10 mg total) by mouth 2 (two) times daily before a meal. 30 tablet 0  . IBUPROFEN 200 MG PO TABS Oral Take 200 mg by mouth every 6 (six) hours as needed. For inflammation    . LEVOTHYROXINE SODIUM 175 MCG PO TABS  take 1 tablet daily 60 tablet 0    BP 136/87  Pulse 90  Temp(Src) 97.8 F (36.6 C) (Oral)  Resp 20  SpO2 99%  Physical Exam  Nursing note and vitals reviewed. Constitutional: She is oriented to person, place, and time. She appears well-developed and well-nourished.  HENT:  Head: Normocephalic.  Eyes: Conjunctivae are normal.  Neck: Neck supple. No JVD present. No thyromegaly present.  Lymphadenopathy:    She has no cervical adenopathy.  Neurological: She is alert and oriented to person, place, and time.  Skin: Skin is warm. No erythema.  Psychiatric: Her speech is normal and behavior is normal. Judgment and thought content normal. Her mood appears anxious. Her affect is not angry, not labile and  not inappropriate. Cognition and memory are normal.    ED Course  Procedures (including critical care time)   Labs Reviewed  TSH   No results found.   1. Medication refill   2. Hypothyroidism   3. Anxiety    Today we have drawn baseline i-STAT and a TSH level. Patient will be contacted if abnormal test results.   MDM  Patient has appointment with a local primary care Dr. in mid June. Requesting medication refills for her thyroid as well as she is expressing anxiety related symptoms and has a history of panic attacks. She's also requesting help with her anxiety related symptoms.  We also started patient on BuSpar encourage her to followup with our behavioral clinic within the next 2-4 weeks. For further treatment. Patient agreed with treatment plan and followup  care with both her primary care Dr. Ellis Parents) and a behavioral specialist.        Jimmie Molly, MD 01/26/12 1236  Jimmie Molly, MD 01/26/12 2122

## 2012-01-26 NOTE — ED Notes (Signed)
Patient reports her pcp in [place].  Patient has recently moved back to [place] 1-54-months ago.  Needs refill of thyroid medicine, pcp will not without labs being checked.  Patient is also concerned about panic attacks, has also been diagnosed with panic attacks in the past.  Requesting vitamin d level to be checked.

## 2012-01-26 NOTE — ED Notes (Signed)
Discharge delay secondary to blood draw

## 2012-01-26 NOTE — ED Notes (Signed)
Patient will have insurance go into effect this month.

## 2012-01-30 ENCOUNTER — Telehealth (HOSPITAL_COMMUNITY): Payer: Self-pay | Admitting: *Deleted

## 2012-01-30 NOTE — ED Notes (Signed)
Pt notified TSH level is low at 0.019.  Instructed to follow up with her PCP for monitoring and titration of Synthroid per Dr. Ladon Applebaum.  Pt verbalized understanding.

## 2012-03-10 ENCOUNTER — Encounter (HOSPITAL_COMMUNITY): Payer: Self-pay | Admitting: *Deleted

## 2012-03-10 ENCOUNTER — Emergency Department (HOSPITAL_COMMUNITY)
Admission: EM | Admit: 2012-03-10 | Discharge: 2012-03-10 | Disposition: A | Discharge status: Home / Self Care | Payer: Commercial Managed Care - PPO | Attending: Emergency Medicine | Admitting: Emergency Medicine

## 2012-03-10 DIAGNOSIS — M79609 Pain in unspecified limb: Secondary | ICD-10-CM | POA: Insufficient documentation

## 2012-03-10 DIAGNOSIS — E079 Disorder of thyroid, unspecified: Secondary | ICD-10-CM | POA: Insufficient documentation

## 2012-03-10 DIAGNOSIS — M79606 Pain in leg, unspecified: Secondary | ICD-10-CM

## 2012-03-10 LAB — POCT I-STAT, CHEM 8
Chloride: 102 mEq/L (ref 96–112)
HCT: 38 % (ref 36.0–46.0)
Potassium: 3.7 mEq/L (ref 3.5–5.1)

## 2012-03-10 LAB — CK: Total CK: 96 U/L (ref 7–177)

## 2012-03-10 MED ORDER — OXYCODONE-ACETAMINOPHEN 5-325 MG PO TABS: 1.0000 | ORAL_TABLET | ORAL | Status: DC | PRN

## 2012-03-10 NOTE — ED Notes (Signed)
Pt reports he pain bilaterally and feels like too much sensation; cannot tolerate blanket or too much touch on leg. She reports is also feels like they are swollen but they are not. Pt has tried to elevate with no relief. Last time she said she had this problem, she had a decrease in vitamin D.

## 2012-03-10 NOTE — ED Notes (Signed)
Pt c/o bilateral lower extremity pain x 2 mos. States she was treated in ED 2 mos ago for same, at which time [place] was negative. Pt reports no relief or change in pain since that time. States pain is slightly less with activity. Pt resting in a position of comfort at this time. VSS. Denies further needs

## 2012-03-10 NOTE — ED Notes (Signed)
The pt has had leg pain bi-lateral  For 2 months.  The tops of her thighs and her shinns.  She saw her doctor earlier today for other problems

## 2012-03-10 NOTE — ED Provider Notes (Signed)
History     CSN: [id number]  Arrival date & time 03/10/12  0222   First MD Initiated Contact with Patient 03/10/12 [phone]      Chief Complaint  Patient presents with  . Leg Pain     Patient is a 46 y.o. female presenting with leg pain. The history is provided by the patient.  Leg Pain  The incident occurred more than 1 week ago. There was no injury mechanism. Pain location: bilateral thights and tibial surface. The pain is mild. The pain has been constant since onset. Pertinent negatives include no numbness, no inability to bear weight, no loss of motion and no loss of sensation. Exacerbated by: rest. She has tried rest for the symptoms. The treatment provided no relief.  pt presents for bilateral leg pain for at least two months She reports pain in thighs/anterior tibial surfaces for at least 2 months.   No trauma She can ambulate No fever/cp/sob No radiation of pain from her back No erythema to legs No known h/o DVT, no recent travel Saw PCP for ear infection today but did not mention leg pain Denies recent excessive work or overexertion or heat exposure Past Medical History  Diagnosis Date  . Anxiety   . Panic attacks   . Thyroid disease     Past Surgical History  Procedure Date  . Thyriod     surgery  . Cholecystectomy   . Tubal ligation   . Abdominal hysterectomy     No family history on file.  History  Substance Use Topics  . Smoking status: Never Smoker   . Smokeless tobacco: Not on file  . Alcohol Use: No    OB History    Grav Para Term Preterm Abortions TAB SAB Ect Mult Living                  Review of Systems  Constitutional: Negative for fever.  Respiratory: Negative for shortness of breath.   Cardiovascular: Negative for chest pain.  Musculoskeletal: Negative for joint swelling.  Neurological: Negative for numbness.  All other systems reviewed and are negative.    Allergies  Amitriptyline hcl; Pseudoephedrine; Sertraline; and  Milk-related compounds  Home Medications   Current Outpatient Rx  Name Route Sig Dispense Refill  . ANTIPYRINE-BENZOCAINE 5.4-1.4 % OT SOLN Both Ears Place 4 drops into both ears 2 (two) times daily.    . BUSPIRONE HCL 10 MG PO TABS Oral Take 1 tablet (10 mg total) by mouth 2 (two) times daily before a meal. 30 tablet 0  . VITAMIN D-3 5000 UNITS PO TABS Oral Take 5,000 Units by mouth daily.    Marland Kitchen LEVOFLOXACIN 750 MG PO TABS Oral Take 750 mg by mouth daily.    Marland Kitchen LEVOTHYROXINE SODIUM 175 MCG PO TABS  take 1 tablet daily 60 tablet 0  . THERA M PLUS PO TABS Oral Take 1 tablet by mouth daily.    Marland Kitchen PANTOPRAZOLE SODIUM 40 MG PO TBEC Oral Take 40 mg by mouth daily.      BP 122/73  Pulse 81  Temp 97.9 F (36.6 C) (Oral)  Resp 16  SpO2 100%  Physical Exam CONSTITUTIONAL: Well developed/well nourished HEAD AND FACE: Normocephalic/atraumatic EYES: EOMI/PERRL ENMT: Mucous membranes moist NECK: supple no meningeal signs SPINE:entire spine nontender CV: S1/S2 noted, no murmurs/rubs/gallops noted LUNGS: Lungs are clear to auscultation bilaterally, no apparent distress ABDOMEN: soft, nontender, no rebound or guarding GU:no cva tenderness NEURO: Pt is awake/alert, moves all extremitiesx4 EXTREMITIES: pulses  normal, full ROM. No edema, no erythema.  No calf tenderness.  Legs are symmetric.  She has full ROM of each knee/ankle.  No bruising noted.   SKIN: warm, color normal PSYCH: no abnormalities of mood noted   ED Course  Procedures  Labs Reviewed  POCT I-STAT, CHEM 8 - Abnormal; Notable for the following:    Glucose, Bld 112 (*)     All other components within normal limits  CK    Labs reassuring Stable for d/c  MDM  Nursing notes including past medical history and social history reviewed and considered in documentation All labs/vitals reviewed and considered         Joya Gaskins, MD 03/10/12 [phone]

## 2012-03-15 ENCOUNTER — Encounter (HOSPITAL_COMMUNITY): Payer: Self-pay | Admitting: *Deleted

## 2012-03-15 ENCOUNTER — Emergency Department (HOSPITAL_COMMUNITY)
Admission: EM | Admit: 2012-03-15 | Discharge: 2012-03-16 | Disposition: A | Discharge status: Home / Self Care | Payer: Commercial Managed Care - PPO | Attending: Emergency Medicine | Admitting: Emergency Medicine

## 2012-03-15 DIAGNOSIS — J329 Chronic sinusitis, unspecified: Secondary | ICD-10-CM

## 2012-03-15 DIAGNOSIS — F410 Panic disorder [episodic paroxysmal anxiety] without agoraphobia: Secondary | ICD-10-CM | POA: Insufficient documentation

## 2012-03-15 DIAGNOSIS — E079 Disorder of thyroid, unspecified: Secondary | ICD-10-CM | POA: Insufficient documentation

## 2012-03-15 HISTORY — DX: Otitis media, unspecified, unspecified ear: H66.90

## 2012-03-15 HISTORY — DX: Urinary tract infection, site not specified: N39.0

## 2012-03-15 LAB — URINALYSIS, ROUTINE W REFLEX MICROSCOPIC
Leukocytes, UA: NEGATIVE
Nitrite: NEGATIVE
Specific Gravity, Urine: 1.021 (ref 1.005–1.030)
pH: 7 (ref 5.0–8.0)

## 2012-03-15 NOTE — ED Notes (Signed)
Pt was tx here on Fri for complications of dizziness r/t chronic ear infection (1 month) and a uti.  She was d/c'd with a prescription for prednisone which she did not take b/c she did not feel it would help her dizziness.  The symptoms increased (R facial pain, eye pain, bil ear pain, dizziness).  Pt called her doctors office today and they stated to return to the ED.

## 2012-03-16 MED ORDER — PHENYLEPHRINE HCL 1 % NA SOLN: 1.0000 [drp] | Freq: Once | NASAL | Status: AC

## 2012-03-16 NOTE — ED Provider Notes (Signed)
History     CSN: [id number]  Arrival date & time 03/15/12  2055   First MD Initiated Contact with Patient 03/15/12 2355      Chief Complaint  Patient presents with  . Dizziness  . Facial Pain  . Sore Throat    (Consider location/radiation/quality/duration/timing/severity/associated sxs/prior treatment) Patient is a 46 y.o. female presenting with pharyngitis. The history is provided by the patient.  Sore Throat This is a recurrent problem. Associated symptoms include abdominal pain and headaches. Pertinent negatives include no chest pain.   patient has had nasal congestion and facial pain with ear pain for the last week. She's also been seen for dizziness related to ear infections. She's currently on antibiotics. She's had a week of levofloxacin. No fevers. No cough. She does have little bit of sore throat. No trouble hearing. She has episodes of lightheadedness. She has a headache that will come and go. No localizing numbness or weakness. She was also recently treated for urinary tract infection. She's had some upper abdominal pain also. No trouble hearing. She states sometimes she'll have some vision trouble with the right eye. Patient is concerned she may be having a stroke.  Past Medical History  Diagnosis Date  . Anxiety   . Panic attacks   . Thyroid disease   . Chronic ear infection   . UTI (lower urinary tract infection)     Past Surgical History  Procedure Date  . Thyriod     surgery  . Cholecystectomy   . Tubal ligation   . Abdominal hysterectomy     No family history on file.  History  Substance Use Topics  . Smoking status: Never Smoker   . Smokeless tobacco: Not on file  . Alcohol Use: No    OB History    Grav Para Term Preterm Abortions TAB SAB Ect Mult Living                  Review of Systems  Constitutional: Positive for fatigue. Negative for chills.  HENT: Positive for ear pain, congestion, sore throat, rhinorrhea and sinus pressure. Negative  for hearing loss, drooling, neck pain and tinnitus.   Respiratory: Negative for cough.   Cardiovascular: Negative for chest pain.  Gastrointestinal: Positive for abdominal pain.  Genitourinary: Negative for hematuria.  Musculoskeletal: Negative for back pain.  Neurological: Positive for headaches. Negative for light-headedness and numbness.  Hematological: Negative for adenopathy.  Psychiatric/Behavioral: Negative for agitation.    Allergies  Amitriptyline hcl; Pseudoephedrine; Sertraline; and Milk-related compounds  Home Medications   Current Outpatient Rx  Name Route Sig Dispense Refill  . ANTIPYRINE-BENZOCAINE 5.4-1.4 % OT SOLN Both Ears Place 4 drops into both ears 2 (two) times daily.    . BUSPIRONE HCL 10 MG PO TABS Oral Take 5 mg by mouth 2 (two) times daily.    Marland Kitchen VITAMIN D-3 5000 UNITS PO TABS Oral Take 5,000 Units by mouth 2 (two) times a week.     Marland Kitchen LEVOFLOXACIN 750 MG PO TABS Oral Take 750 mg by mouth daily.    Marland Kitchen LEVOTHYROXINE SODIUM 137 MCG PO TABS Oral Take 137 mcg by mouth daily.    Carma Leaven M PLUS PO TABS Oral Take 1 tablet by mouth daily.    Marland Kitchen PANTOPRAZOLE SODIUM 40 MG PO TBEC Oral Take 40 mg by mouth every morning.     Marland Kitchen PHENYLEPHRINE HCL 1 % NA SOLN Nasal Place 1 drop into the nose once. 30 mL 0    BP  123/80  Pulse 72  Temp 98.4 F (36.9 C) (Oral)  Resp 17  SpO2 100%  Physical Exam  Nursing note and vitals reviewed. Constitutional: She is oriented to person, place, and time. She appears well-developed and well-nourished.  HENT:  Head: Normocephalic and atraumatic.       Tenderness over right maxillary sinus. Bilateral TMs possible effusions. Minimal erythema.  Eyes: EOM are normal. Pupils are equal, round, and reactive to light.  Neck: Normal range of motion. Neck supple.  Cardiovascular: Normal rate, regular rhythm and normal heart sounds.   No murmur heard. Pulmonary/Chest: Effort normal and breath sounds normal. No respiratory distress. She has no  wheezes. She has no rales.  Abdominal: Soft. Bowel sounds are normal. She exhibits no distension. There is no tenderness. There is no rebound and no guarding.  Musculoskeletal: Normal range of motion.  Neurological: She is alert and oriented to person, place, and time. No cranial nerve deficit.  Skin: Skin is warm and dry.  Psychiatric: She has a normal mood and affect. Her speech is normal.    ED Course  Procedures (including critical care time)   Labs Reviewed  URINALYSIS, ROUTINE W REFLEX MICROSCOPIC   No results found.   1. Sinusitis       MDM  Patient with continued sinus pressure. Radiates to eye and jaw. No tenderness over mastoid. Likely partially treated sinusitis at this time. Patient states she is allergic to pseudoephedrine. We'll give a trial of nasal phenylephrine. After only a week Levaquin I'm not ready cul-de-sac treatment failure. She has a Percocet prescription at home but has not been using it. She will now and will followup with her primary care Dr. for ENT next week as planned. Doubt intracranial infection.fill the prescription        Juliet Rude. Rubin Payor, MD 03/16/12 [phone]

## 2012-03-22 ENCOUNTER — Other Ambulatory Visit: Payer: Self-pay | Admitting: Obstetrics and Gynecology

## 2012-03-22 DIAGNOSIS — Z1231 Encounter for screening mammogram for malignant neoplasm of breast: Secondary | ICD-10-CM

## 2012-04-02 ENCOUNTER — Ambulatory Visit
Admission: RE | Admit: 2012-04-02 | Discharge: 2012-04-02 | Disposition: A | Discharge status: Home / Self Care | Payer: Commercial Managed Care - PPO | Source: Ambulatory Visit | Attending: Obstetrics and Gynecology | Admitting: Obstetrics and Gynecology

## 2012-04-02 DIAGNOSIS — Z1231 Encounter for screening mammogram for malignant neoplasm of breast: Secondary | ICD-10-CM

## 2012-05-25 ENCOUNTER — Emergency Department (HOSPITAL_COMMUNITY)
Admission: EM | Admit: 2012-05-25 | Discharge: 2012-05-25 | Disposition: A | Discharge status: Home / Self Care | Payer: Commercial Managed Care - PPO | Attending: Emergency Medicine | Admitting: Emergency Medicine

## 2012-05-25 ENCOUNTER — Encounter (HOSPITAL_COMMUNITY): Payer: Self-pay | Admitting: Emergency Medicine

## 2012-05-25 DIAGNOSIS — R51 Headache: Secondary | ICD-10-CM

## 2012-05-25 DIAGNOSIS — F411 Generalized anxiety disorder: Secondary | ICD-10-CM | POA: Insufficient documentation

## 2012-05-25 DIAGNOSIS — R519 Headache, unspecified: Secondary | ICD-10-CM | POA: Insufficient documentation

## 2012-05-25 DIAGNOSIS — Z91011 Allergy to milk products: Secondary | ICD-10-CM | POA: Insufficient documentation

## 2012-05-25 DIAGNOSIS — Z888 Allergy status to other drugs, medicaments and biological substances status: Secondary | ICD-10-CM | POA: Insufficient documentation

## 2012-05-25 HISTORY — DX: Headache, unspecified: R51.9

## 2012-05-25 HISTORY — DX: Headache: R51

## 2012-05-25 MED ORDER — SODIUM CHLORIDE 0.9 % IV BOLUS (SEPSIS)
1000.0000 mL | Freq: Once | INTRAVENOUS | Status: AC
  Administered 2012-05-25: 1000 mL via INTRAVENOUS

## 2012-05-25 MED ORDER — KETOROLAC TROMETHAMINE 30 MG/ML IJ SOLN
30.0000 mg | Freq: Once | INTRAMUSCULAR | Status: AC
  Administered 2012-05-25: 30 mg via INTRAVENOUS
  Filled 2012-05-25: qty 1

## 2012-05-25 MED ORDER — NAPROXEN 500 MG PO TABS: 500.0000 mg | ORAL_TABLET | Freq: Two times a day (BID) | ORAL | Status: DC

## 2012-05-25 MED ORDER — METOCLOPRAMIDE HCL 10 MG PO TABS: 10.0000 mg | ORAL_TABLET | Freq: Three times a day (TID) | ORAL | Status: DC | PRN

## 2012-05-25 MED ORDER — METOCLOPRAMIDE HCL 5 MG/ML IJ SOLN
10.0000 mg | Freq: Once | INTRAMUSCULAR | Status: AC
  Administered 2012-05-25: 10 mg via INTRAVENOUS
  Filled 2012-05-25: qty 2

## 2012-05-25 MED ORDER — DIPHENHYDRAMINE HCL 50 MG/ML IJ SOLN
25.0000 mg | Freq: Once | INTRAMUSCULAR | Status: AC
  Administered 2012-05-25: 25 mg via INTRAVENOUS
  Filled 2012-05-25: qty 1

## 2012-05-25 NOTE — ED Notes (Signed)
Patient reports constant headache for the last four days.  Patient reports having problems with headaches for the last several months -- last headache lasted for about two weeks; diagnosed with pre-hypertension.  Reports light sensitivity and dizziness.  Denies blurred vision.

## 2012-05-25 NOTE — ED Notes (Signed)
Patient presents stating that she has had a headache for the past 3 days and when she has these headaches her BP goes up.  No c/o blurry vision +N/V

## 2012-05-25 NOTE — ED Provider Notes (Signed)
History     CSN: [id number]  Arrival date & time 05/25/12  0136   First MD Initiated Contact with Patient 05/25/12 0145      Chief Complaint  Patient presents with  . Headache    (Consider location/radiation/quality/duration/timing/severity/associated sxs/prior treatment) HPI Comments: Pt has had headaches for several months, but did have headaches after "accident" a long time ago - came back several months ago - was seen PMD and at [facility] and has had CT scan, MRI and these she reports to be normal without any masses.  She was started on a BB for hypertensive.   This last headache has been ongoing X 4 days - is on the R frontal and retroorbital and to the R ear.  This is pressure and stabbing and causes watering of the R eye.  She has no relief with sleep and often wakes up with the headache.  Denies fevers and chills, no n/v but does have some dizzyness.  No changes in vision.  Took no pain meds today prior to arrival.  She has been treated with with vicodin and butalbital and states this hasn't helped.      Patient is a 46 y.o. female presenting with headaches. The history is provided by the patient.  Headache  Pertinent negatives include no fever, no shortness of breath, no nausea and no vomiting.    Past Medical History  Diagnosis Date  . Anxiety   . Panic attacks   . Thyroid disease   . Chronic ear infection   . UTI (lower urinary tract infection)   . Generalized headaches     Past Surgical History  Procedure Date  . Thyriod     surgery  . Cholecystectomy   . Tubal ligation   . Abdominal hysterectomy     History reviewed. No pertinent family history.  History  Substance Use Topics  . Smoking status: Never Smoker   . Smokeless tobacco: Not on file  . Alcohol Use: No    OB History    Grav Para Term Preterm Abortions TAB SAB Ect Mult Living                  Review of Systems  Constitutional: Negative for fever and chills.  HENT:  Negative for sore throat, facial swelling, neck pain and neck stiffness.   Eyes: Positive for discharge. Negative for photophobia and redness.  Respiratory: Negative for shortness of breath.   Cardiovascular: Negative for chest pain.  Gastrointestinal: Negative for nausea, vomiting and abdominal pain.  Musculoskeletal: Negative for back pain.  Skin: Negative for rash.  Neurological: Positive for headaches. Negative for weakness and numbness.    Allergies  Amitriptyline hcl; Pseudoephedrine; Sertraline; and Milk-related compounds  Home Medications   Current Outpatient Rx  Name Route Sig Dispense Refill  . ATENOLOL 50 MG PO TABS Oral Take 50 mg by mouth daily.    . BUSPIRONE HCL 10 MG PO TABS Oral Take 5 mg by mouth 2 (two) times daily.    Marland Kitchen VITAMIN D-3 5000 UNITS PO TABS Oral Take 5,000 Units by mouth 2 (two) times a week.     Marland Kitchen LEVOTHYROXINE SODIUM 137 MCG PO TABS Oral Take 137 mcg by mouth daily.    Marland Kitchen PANTOPRAZOLE SODIUM 40 MG PO TBEC Oral Take 40 mg by mouth every morning.     Marland Kitchen METOCLOPRAMIDE HCL 10 MG PO TABS Oral Take 1 tablet (10 mg total) by mouth 3 (three) times daily as needed (headache /  nausea). 20 tablet 0  . NAPROXEN 500 MG PO TABS Oral Take 1 tablet (500 mg total) by mouth 2 (two) times daily with a meal. 30 tablet 0    BP 121/62  Pulse 67  Temp 98.7 F (37.1 C) (Oral)  Resp 18  SpO2 100%  Physical Exam  Nursing note and vitals reviewed. Constitutional: She appears well-developed and well-nourished. No distress.  HENT:  Head: Normocephalic and atraumatic.  Mouth/Throat: Oropharynx is clear and moist. No oropharyngeal exudate.  Eyes: Conjunctivae normal and EOM are normal. Pupils are equal, round, and reactive to light. Right eye exhibits no discharge. Left eye exhibits no discharge. No scleral icterus.  Neck: Normal range of motion. Neck supple. No JVD present. No thyromegaly present.  Cardiovascular: Normal rate, regular rhythm, normal heart sounds and intact  distal pulses.  Exam reveals no gallop and no friction rub.   No murmur heard. Pulmonary/Chest: Effort normal and breath sounds normal. No respiratory distress. She has no wheezes. She has no rales.  Abdominal: Soft. Bowel sounds are normal. She exhibits no distension and no mass. There is no tenderness.  Musculoskeletal: Normal range of motion. She exhibits no edema and no tenderness.  Lymphadenopathy:    She has no cervical adenopathy.  Neurological: She is alert. Coordination normal.       Neurologic exam:  Speech clear, pupils equal round reactive to light, extraocular movements intact  Normal peripheral visual fields Cranial nerves III through XII normal including no facial droop Follows commands, moves all extremities x4, normal strength to bilateral upper and lower extremities at all major muscle groups including grip Sensation normal to light touch and pinprick Coordination intact, no limb ataxia, finger-nose-finger normal Rapid alternating movements normal No pronator drift Gait normal   Skin: Skin is warm and dry. No rash noted. No erythema.  Psychiatric: She has a normal mood and affect. Her behavior is normal.    ED Course  Procedures (including critical care time)  Labs Reviewed - No data to display No results found.   1. 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  Will give supplemental O2, cocktail and reeval.  After O2 by [place] and Toradol, Reglan, Benadryl and fluids headache has resolved - has been given information on f/u - to see Neuro this month.      Vida Roller, MD 05/25/12 [phone]

## 2012-10-25 ENCOUNTER — Emergency Department (HOSPITAL_COMMUNITY)
Admission: EM | Admit: 2012-10-25 | Discharge: 2012-10-26 | Disposition: A | Discharge status: Home / Self Care | Payer: Commercial Managed Care - PPO

## 2012-10-25 NOTE — ED Notes (Signed)
Called x's 3 without answer 

## 2020-09-14 ENCOUNTER — Other Ambulatory Visit (HOSPITAL_BASED_OUTPATIENT_CLINIC_OR_DEPARTMENT_OTHER): Payer: Self-pay | Admitting: Family Medicine

## 2020-09-14 DIAGNOSIS — Z8639 Personal history of other endocrine, nutritional and metabolic disease: Secondary | ICD-10-CM

## 2020-09-16 ENCOUNTER — Other Ambulatory Visit: Payer: Self-pay | Admitting: Family Medicine

## 2020-09-16 DIAGNOSIS — Z1231 Encounter for screening mammogram for malignant neoplasm of breast: Secondary | ICD-10-CM

## 2020-09-23 ENCOUNTER — Ambulatory Visit (HOSPITAL_BASED_OUTPATIENT_CLINIC_OR_DEPARTMENT_OTHER): Payer: BLUE CROSS/BLUE SHIELD

## 2020-09-23 ENCOUNTER — Other Ambulatory Visit: Payer: Self-pay | Admitting: Family Medicine

## 2020-09-23 DIAGNOSIS — Z8639 Personal history of other endocrine, nutritional and metabolic disease: Secondary | ICD-10-CM

## 2020-09-23 DIAGNOSIS — R1310 Dysphagia, unspecified: Secondary | ICD-10-CM

## 2020-09-28 ENCOUNTER — Inpatient Hospital Stay (HOSPITAL_BASED_OUTPATIENT_CLINIC_OR_DEPARTMENT_OTHER): Admission: RE | Admit: 2020-09-28 | Payer: BLUE CROSS/BLUE SHIELD | Source: Ambulatory Visit

## 2020-09-30 ENCOUNTER — Ambulatory Visit (HOSPITAL_BASED_OUTPATIENT_CLINIC_OR_DEPARTMENT_OTHER): Payer: BLUE CROSS/BLUE SHIELD

## 2020-10-02 ENCOUNTER — Other Ambulatory Visit: Payer: BLUE CROSS/BLUE SHIELD

## 2020-11-02 ENCOUNTER — Ambulatory Visit: Payer: Commercial Managed Care - PPO

## 2020-11-16 ENCOUNTER — Other Ambulatory Visit: Payer: Self-pay

## 2020-12-20 ENCOUNTER — Emergency Department: Payer: BLUE CROSS/BLUE SHIELD

## 2020-12-20 ENCOUNTER — Other Ambulatory Visit: Payer: Self-pay

## 2020-12-20 ENCOUNTER — Emergency Department
Admission: EM | Admit: 2020-12-20 | Discharge: 2020-12-20 | Disposition: A | Discharge status: Home / Self Care | Payer: BLUE CROSS/BLUE SHIELD | Attending: Emergency Medicine | Admitting: Emergency Medicine

## 2020-12-20 DIAGNOSIS — J9801 Acute bronchospasm: Secondary | ICD-10-CM | POA: Insufficient documentation

## 2020-12-20 DIAGNOSIS — E119 Type 2 diabetes mellitus without complications: Secondary | ICD-10-CM | POA: Diagnosis not present

## 2020-12-20 DIAGNOSIS — E079 Disorder of thyroid, unspecified: Secondary | ICD-10-CM | POA: Insufficient documentation

## 2020-12-20 DIAGNOSIS — Z20822 Contact with and (suspected) exposure to covid-19: Secondary | ICD-10-CM | POA: Insufficient documentation

## 2020-12-20 DIAGNOSIS — Z79899 Other long term (current) drug therapy: Secondary | ICD-10-CM | POA: Insufficient documentation

## 2020-12-20 DIAGNOSIS — R059 Cough, unspecified: Secondary | ICD-10-CM | POA: Diagnosis present

## 2020-12-20 HISTORY — DX: Type 2 diabetes mellitus without complications: E11.9

## 2020-12-20 LAB — RESP PANEL BY RT-PCR (FLU A&B, COVID) ARPGX2
Influenza A by PCR: NEGATIVE
Influenza B by PCR: NEGATIVE
SARS Coronavirus 2 by RT PCR: NEGATIVE

## 2020-12-20 MED ORDER — METHYLPREDNISOLONE 4 MG PO TBPK: ORAL_TABLET | ORAL | 0 refills | Status: DC

## 2020-12-20 MED ORDER — BENZONATATE 100 MG PO CAPS: 200.0000 mg | ORAL_CAPSULE | Freq: Three times a day (TID) | ORAL | 0 refills | Status: DC | PRN

## 2020-12-20 MED ORDER — FEXOFENADINE HCL 60 MG PO TABS: 60.0000 mg | ORAL_TABLET | Freq: Two times a day (BID) | ORAL | 0 refills | Status: DC

## 2020-12-20 NOTE — ED Provider Notes (Signed)
[facility] Emergency Department Provider Note   ____________________________________________   Event Date/Time   First MD Initiated Contact with Patient 12/20/20 1317     (approximate)  I have reviewed the triage vital signs and the nursing notes.   HISTORY  Chief Complaint Cough and Nasal Congestion    HPI Alexis Holt is a 54 y.o. female patient complaint of 3 days of nonproductive cough, nasal congestion, and chest congestion.  Patient also complain of body aches.  Patient denies recent travel or known contact with COVID-19.  Patient is taking COVID vaccines but has not taken the flu vaccine.  Patient rates her pain/discomfort as 8/10.  Patient described pain as "achy".  No relief with over-the-counter Mucinex.         Past Medical History:  Diagnosis Date  . Anxiety   . Chronic ear infection   . Diabetes mellitus without complication (HCC)   . Generalized headaches   . Panic attacks   . Thyroid disease   . UTI (lower urinary tract infection)     Patient Active Problem List   Diagnosis Date Noted  . Generalized headaches     Past Surgical History:  Procedure Laterality Date  . ABDOMINAL HYSTERECTOMY    . CHOLECYSTECTOMY    . thyriod     surgery  . TUBAL LIGATION      Prior to Admission medications   Medication Sig Start Date End Date Taking? Authorizing Provider  benzonatate (TESSALON PERLES) 100 MG capsule Take 2 capsules (200 mg total) by mouth 3 (three) times daily as needed. 12/20/20 12/20/21 Yes Joni Reining, PA-C  fexofenadine (ALLEGRA) 60 MG tablet Take 1 tablet (60 mg total) by mouth 2 (two) times daily. 12/20/20  Yes Joni Reining, PA-C  methylPREDNISolone (MEDROL DOSEPAK) 4 MG TBPK tablet Take Tapered dose as directed 12/20/20  Yes Joni Reining, PA-C  atenolol (TENORMIN) 50 MG tablet Take 50 mg by mouth daily.    [provider]  busPIRone (BUSPAR) 10 MG tablet Take 5 mg by mouth 2 (two) times daily.     [provider]  Cholecalciferol (VITAMIN D-3) 5000 UNITS TABS Take 5,000 Units by mouth 2 (two) times a week.     [provider]  levothyroxine (SYNTHROID, LEVOTHROID) 137 MCG tablet Take 137 mcg by mouth daily.    [provider]  metoCLOPramide (REGLAN) 10 MG tablet Take 1 tablet (10 mg total) by mouth 3 (three) times daily as needed (headache / nausea). 05/25/12   Eber Hong, MD  naproxen (NAPROSYN) 500 MG tablet Take 1 tablet (500 mg total) by mouth 2 (two) times daily with a meal. 05/25/12   Eber Hong, MD  pantoprazole (PROTONIX) 40 MG tablet Take 40 mg by mouth every morning.     [provider]    Allergies Amitriptyline hcl, Pseudoephedrine, Sertraline, Milk-related compounds, and Oxycodone  History reviewed. No pertinent family history.  Social History Social History   Tobacco Use  . Smoking status: Never Smoker  Substance Use Topics  . Alcohol use: No  . Drug use: No    Review of Systems Constitutional: No fever/chills Eyes: No visual changes. ENT: No sore throat. Cardiovascular: Denies chest pain. Respiratory: Denies shortness of breath.  Cough and chest congestion. Gastrointestinal: No abdominal pain.  No nausea, no vomiting.  No diarrhea.  No constipation. Genitourinary: Negative for dysuria. Musculoskeletal: Negative for back pain. Skin: Negative for rash. Neurological: Negative for headaches, focal weakness or numbness. Psychiatric: Anxiety  Endocrine: Diabetes and hypothyroidism.  Hematological/Lymphatic:  Allergic/Immunilogical: Amitriptyline, oxycodone, Sudafed, sertraline and milk products. ____________________________________________   PHYSICAL EXAM:  VITAL SIGNS: ED Triage Vitals  Enc Vitals Group     BP 12/20/20 1312 (!) 119/99     Pulse Rate 12/20/20 1312 85     Resp 12/20/20 1312 18     Temp 12/20/20 1312 98.8 F (37.1 C)     Temp Source 12/20/20 1312 Oral     SpO2 12/20/20 1312 96 %     Weight  12/20/20 1310 192 lb (87.1 kg)     Height 12/20/20 1310 5\' 3"  (1.6 m)     Head Circumference --      Peak Flow --      Pain Score 12/20/20 1310 8     Pain Loc --      Pain Edu? --      Excl. in GC? --    Constitutional: Alert and oriented. Well appearing and in no acute distress. Eyes: Conjunctivae are normal. PERRL. EOMI. Head: Atraumatic. Nose:   Clear rhinorrhea. Mouth/Throat: Mucous membranes are moist.  Oropharynx non-erythematous.  Postnasal drainage. Neck: No stridor.  Hematological/Lymphatic/Immunilogical: No cervical lymphadenopathy. Cardiovascular: Normal rate, regular rhythm. Grossly normal heart sounds.  Good peripheral circulation. Respiratory: Normal respiratory effort.  No retractions. Lungs CTAB. Gastrointestinal: Soft and nontender. No distention. No abdominal bruits. No CVA tenderness. Genitourinary: Deferred Musculoskeletal: No lower extremity tenderness nor edema.  No joint effusions. Neurologic:  Normal speech and language. No gross focal neurologic deficits are appreciated. No gait instability. Skin:  Skin is warm, dry and intact. No rash noted. Psychiatric: Mood and affect are normal. Speech and behavior are normal.  ____________________________________________   LABS (all labs ordered are listed, but only abnormal results are displayed)  Labs Reviewed  RESP PANEL BY RT-PCR (FLU A&B, COVID) ARPGX2   ____________________________________________  EKG   ____________________________________________  RADIOLOGY I, 02/19/21, personally viewed and evaluated these images (plain radiographs) as part of my medical decision making, as well as reviewing the written report by the radiologist.  ED MD interpretation: No acute findings on chest x-ray.  Official radiology report(s): DG Chest 2 View  Result Date: 12/20/2020 CLINICAL DATA:  Flu like symptoms. Productive cough, congestion and drainage. EXAM: CHEST - 2 VIEW COMPARISON:  None. FINDINGS: Heart size  and mediastinal contours are within normal limits. Lungs are clear. No pleural effusion. Osseous structures about the chest are unremarkable. IMPRESSION: No active cardiopulmonary disease.  No evidence of pneumonia. Electronically Signed   By: 02/19/2021 M.D.   On: 12/20/2020 14:14    ____________________________________________   PROCEDURES  Procedure(s) performed (including Critical Care):  Procedures   ____________________________________________   INITIAL IMPRESSION / ASSESSMENT AND PLAN / ED COURSE  As part of my medical decision making, I reviewed the following data within the electronic MEDICAL RECORD NUMBER         Patient presents with nasal congestion and cough.  Discussed no acute findings on chest x-ray patient.  Patient was negative for COVID-19 and influenza.  Patient complaining physical exam consistent with bronchospasm secondary to postnasal drainage and cough.  Patient given discharge care instruction advised take medication as directed.  Patient advised follow-up PCP.      ____________________________________________   FINAL CLINICAL IMPRESSION(S) / ED DIAGNOSES  Final diagnoses:  Cough due to bronchospasm     ED Discharge Orders         Ordered    benzonatate (TESSALON PERLES) 100 MG  capsule  3 times daily PRN        12/20/20 1504    fexofenadine (ALLEGRA) 60 MG tablet  2 times daily        12/20/20 1504    methylPREDNISolone (MEDROL DOSEPAK) 4 MG TBPK tablet        12/20/20 1504          *Please note:  Alexis Holt was evaluated in Emergency Department on 12/20/2020 for the symptoms described in the history of present illness. She was evaluated in the context of the global COVID-19 pandemic, which necessitated consideration that the patient might be at risk for infection with the SARS-CoV-2 virus that causes COVID-19. Institutional protocols and algorithms that pertain to the evaluation of patients at risk for COVID-19 are in a state of rapid  change based on information released by regulatory bodies including the CDC and federal and state organizations. These policies and algorithms were followed during the patient's care in the ED.  Some ED evaluations and interventions may be delayed as a result of limited staffing during and the pandemic.*   Note:  This document was prepared using Dragon voice recognition software and may include unintentional dictation errors.    Joni Reining, PA-C 12/20/20 1506    Jene Every, MD 12/20/20 [phone]

## 2020-12-20 NOTE — ED Notes (Signed)
Provided DC instructions. Verbalized understanding.  

## 2020-12-20 NOTE — ED Triage Notes (Signed)
Pt comes pov with cough, URI symptoms, etc since Wednesday. NAD. Ambulatory to room.

## 2020-12-20 NOTE — Discharge Instructions (Addendum)
No acute findings on chest x-ray.  Your labs were negative negative for COVID-19 and influenza.  Follow discharge care instruction take medication as directed.  Monitor glucose since she will be taking a short course of steroids.

## 2020-12-20 NOTE — ED Notes (Signed)
First Nurse Note: Pt to ED c/o cough, nasal congestion, and sinus drainage.

## 2020-12-25 ENCOUNTER — Other Ambulatory Visit: Payer: Self-pay

## 2020-12-25 ENCOUNTER — Encounter: Payer: Self-pay | Admitting: Emergency Medicine

## 2020-12-25 ENCOUNTER — Emergency Department
Admission: EM | Admit: 2020-12-25 | Discharge: 2020-12-25 | Disposition: A | Discharge status: Home / Self Care | Payer: BLUE CROSS/BLUE SHIELD | Attending: Emergency Medicine | Admitting: Emergency Medicine

## 2020-12-25 ENCOUNTER — Emergency Department: Payer: BLUE CROSS/BLUE SHIELD

## 2020-12-25 DIAGNOSIS — J019 Acute sinusitis, unspecified: Secondary | ICD-10-CM | POA: Insufficient documentation

## 2020-12-25 DIAGNOSIS — R519 Headache, unspecified: Secondary | ICD-10-CM | POA: Diagnosis present

## 2020-12-25 DIAGNOSIS — E119 Type 2 diabetes mellitus without complications: Secondary | ICD-10-CM | POA: Insufficient documentation

## 2020-12-25 MED ORDER — AMOXICILLIN-POT CLAVULANATE 875-125 MG PO TABS: 1.0000 | ORAL_TABLET | Freq: Two times a day (BID) | ORAL | 0 refills | Status: AC

## 2020-12-25 MED ORDER — AMOXICILLIN-POT CLAVULANATE 875-125 MG PO TABS
1.0000 | ORAL_TABLET | Freq: Once | ORAL | Status: AC
  Administered 2020-12-25: 1 via ORAL
  Filled 2020-12-25: qty 1

## 2020-12-25 MED ORDER — AMOXICILLIN-POT CLAVULANATE 875-125 MG PO TABS: 1.0000 | ORAL_TABLET | Freq: Two times a day (BID) | ORAL | 0 refills | Status: DC

## 2020-12-25 NOTE — ED Triage Notes (Signed)
Pt comes into the ED via POV c/o nasal congestion, facial pressure, chest congestion, and dry cough.  Pt seen on Monday and dx with bronchitis and given prednisone and tessalon pearls.  Pt states she is not getting any better and states it is worse.  No wheezing noted at this time.  Pt able to speak in full sentences.  Pt denies having any mucous production with cough.

## 2020-12-25 NOTE — ED Provider Notes (Signed)
[facility]-EMERGENCY DEPARTMENT  ____________________________________________  Time seen: Approximately 9:45 PM  I have reviewed the triage vital signs and the nursing notes.   HISTORY  Chief Complaint Cough and Nasal Congestion   Historian Patient     HPI Alexis Holt is a 55 y.o. female presents to the emergency department with facial pain, facial pressure, nasal congestion and persistent cough for the past 2 to 3 weeks.  Patient reports that she recently finished a course of prednisone and has been taking Tessalon Perles for cough.  She denies fever and chills.  No chest tightness or chest pain.  No other alleviating measures have been attempted.   Past Medical History:  Diagnosis Date  . Anxiety   . Chronic ear infection   . Diabetes mellitus without complication (HCC)   . Generalized headaches   . Panic attacks   . Thyroid disease   . UTI (lower urinary tract infection)      Immunizations up to date:  Yes.     Past Medical History:  Diagnosis Date  . Anxiety   . Chronic ear infection   . Diabetes mellitus without complication (HCC)   . Generalized headaches   . Panic attacks   . Thyroid disease   . UTI (lower urinary tract infection)     Patient Active Problem List   Diagnosis Date Noted  . Generalized headaches     Past Surgical History:  Procedure Laterality Date  . ABDOMINAL HYSTERECTOMY    . CHOLECYSTECTOMY    . thyriod     surgery  . TUBAL LIGATION      Prior to Admission medications   Medication Sig Start Date End Date Taking? Authorizing Provider  amoxicillin-clavulanate (AUGMENTIN) 875-125 MG tablet Take 1 tablet by mouth 2 (two) times daily for 10 days. 12/25/20 01/04/21 Yes Pia Mau M, PA-C  atenolol (TENORMIN) 50 MG tablet Take 50 mg by mouth daily.    [provider]  benzonatate (TESSALON PERLES) 100 MG capsule Take 2 capsules (200 mg total) by mouth 3 (three) times daily as needed. 12/20/20 12/20/21  Joni Reining, PA-C   busPIRone (BUSPAR) 10 MG tablet Take 5 mg by mouth 2 (two) times daily.    [provider]  Cholecalciferol (VITAMIN D-3) 5000 UNITS TABS Take 5,000 Units by mouth 2 (two) times a week.     [provider]  fexofenadine (ALLEGRA) 60 MG tablet Take 1 tablet (60 mg total) by mouth 2 (two) times daily. 12/20/20   Joni Reining, PA-C  levothyroxine (SYNTHROID, LEVOTHROID) 137 MCG tablet Take 137 mcg by mouth daily.    [provider]  methylPREDNISolone (MEDROL DOSEPAK) 4 MG TBPK tablet Take Tapered dose as directed 12/20/20   Joni Reining, PA-C  metoCLOPramide (REGLAN) 10 MG tablet Take 1 tablet (10 mg total) by mouth 3 (three) times daily as needed (headache / nausea). 05/25/12   Eber Hong, MD  naproxen (NAPROSYN) 500 MG tablet Take 1 tablet (500 mg total) by mouth 2 (two) times daily with a meal. 05/25/12   Eber Hong, MD  pantoprazole (PROTONIX) 40 MG tablet Take 40 mg by mouth every morning.     [provider]    Allergies Amitriptyline hcl, Pseudoephedrine, Sertraline, Milk-related compounds, and Oxycodone  History reviewed. No pertinent family history.  Social History Social History   Tobacco Use  . Smoking status: Never Smoker  . Smokeless tobacco: Never Used  Substance Use Topics  . Alcohol use: No  . Drug use:  No     Review of Systems  Constitutional: No fever/chills Eyes:  No discharge ENT: Patient has facial pressure and pain. Respiratory: no cough. No SOB/ use of accessory muscles to breath Gastrointestinal:   No nausea, no vomiting.  No diarrhea.  No constipation. Musculoskeletal: Negative for musculoskeletal pain. Skin: Negative for rash, abrasions, lacerations, ecchymosis.    ____________________________________________   PHYSICAL EXAM:  VITAL SIGNS: ED Triage Vitals [12/25/20 1950]  Enc Vitals Group     BP (!) 150/79     Pulse Rate 89     Resp 18     Temp 98.5 F (36.9 C)     Temp Source Oral     SpO2 97 %      Weight 190 lb (86.2 kg)     Height 5\' 3"  (1.6 m)     Head Circumference      Peak Flow      Pain Score 8     Pain Loc      Pain Edu?      Excl. in GC?      Constitutional: Alert and oriented. Well appearing and in no acute distress. Eyes: Conjunctivae are normal. PERRL. EOMI. Head: Atraumatic.  Patient has maxillary sinus tenderness to palpation. ENT:      Nose: No congestion/rhinnorhea.      Mouth/Throat: Mucous membranes are moist.  Neck: No stridor.  No cervical spine tenderness to palpation. Cardiovascular: Normal rate, regular rhythm. Normal S1 and S2.  Good peripheral circulation. Respiratory: Normal respiratory effort without tachypnea or retractions. Lungs CTAB. Good air entry to the bases with no decreased or absent breath sounds Gastrointestinal: Bowel sounds x 4 quadrants. Soft and nontender to palpation. No guarding or rigidity. No distention. Musculoskeletal: Full range of motion to all extremities. No obvious deformities noted Neurologic:  Normal for age. No gross focal neurologic deficits are appreciated.  Skin:  Skin is warm, dry and intact. No rash noted. Psychiatric: Mood and affect are normal for age. Speech and behavior are normal.   ____________________________________________   LABS (all labs ordered are listed, but only abnormal results are displayed)  Labs Reviewed - No data to display ____________________________________________  EKG   ____________________________________________  RADIOLOGY , personally viewed and evaluated these images (plain radiographs) as part of my medical decision making, as well as reviewing the written report by the radiologist.  DG Chest 2 View  Result Date: 12/25/2020 CLINICAL DATA:  Cough EXAM: CHEST - 2 VIEW COMPARISON:  12/20/2020 FINDINGS: The heart size and mediastinal contours are within normal limits. Both lungs are clear. The visualized skeletal structures are unremarkable. IMPRESSION: No  active cardiopulmonary disease. Electronically Signed   By: 02/19/2021 M.D.   On: 12/25/2020 20:21    ____________________________________________    PROCEDURES  Procedure(s) performed:     Procedures     Medications  amoxicillin-clavulanate (AUGMENTIN) 875-125 MG per tablet 1 tablet (has no administration in time range)     ____________________________________________   INITIAL IMPRESSION / ASSESSMENT AND PLAN / ED COURSE  Pertinent labs & imaging results that were available during my care of the patient were reviewed by me and considered in my medical decision making (see chart for details).      Assessment and plan Sinusitis 55 year old female presents to the emergency department with maxillary and frontal sinus tenderness, nasal congestion and rhinorrhea for the past 2 weeks.  Patient was hypertensive at triage but vital signs were otherwise reassuring.  On exam, patient  was alert, active and nontoxic-appearing.  We will treat with Augmentin twice daily for the next 10 days for sinusitis.  All patient questions were answered.     ____________________________________________  FINAL CLINICAL IMPRESSION(S) / ED DIAGNOSES  Final diagnoses:  Acute sinusitis, recurrence not specified, unspecified location      NEW MEDICATIONS STARTED DURING THIS VISIT:  ED Discharge Orders         Ordered    amoxicillin-clavulanate (AUGMENTIN) 875-125 MG tablet  2 times daily        12/25/20 2143              This chart was dictated using voice recognition software/Dragon. Despite best efforts to proofread, errors can occur which can change the meaning. Any change was purely unintentional.     Gasper Lloyd 12/25/20 2147    Minna Antis, MD 12/28/20 [phone]

## 2020-12-25 NOTE — Discharge Instructions (Addendum)
Take Augmentin twice daily for the next ten days.  

## 2021-02-01 ENCOUNTER — Other Ambulatory Visit: Payer: Self-pay

## 2021-02-01 ENCOUNTER — Ambulatory Visit (INDEPENDENT_AMBULATORY_CARE_PROVIDER_SITE_OTHER): Payer: No Typology Code available for payment source | Admitting: Nurse Practitioner

## 2021-02-01 ENCOUNTER — Encounter: Payer: Self-pay | Admitting: Nurse Practitioner

## 2021-02-01 VITALS — BP 141/77 | HR 85 | Temp 97.7°F | Resp 16 | Ht 63.0 in | Wt 185.8 lb

## 2021-02-01 DIAGNOSIS — E785 Hyperlipidemia, unspecified: Secondary | ICD-10-CM

## 2021-02-01 DIAGNOSIS — Z7689 Persons encountering health services in other specified circumstances: Secondary | ICD-10-CM

## 2021-02-01 DIAGNOSIS — E1169 Type 2 diabetes mellitus with other specified complication: Secondary | ICD-10-CM | POA: Diagnosis not present

## 2021-02-01 DIAGNOSIS — Z0189 Encounter for other specified special examinations: Secondary | ICD-10-CM | POA: Diagnosis not present

## 2021-02-01 DIAGNOSIS — E039 Hypothyroidism, unspecified: Secondary | ICD-10-CM | POA: Diagnosis not present

## 2021-02-01 DIAGNOSIS — E119 Type 2 diabetes mellitus without complications: Secondary | ICD-10-CM | POA: Diagnosis not present

## 2021-02-01 DIAGNOSIS — I10 Essential (primary) hypertension: Secondary | ICD-10-CM | POA: Diagnosis not present

## 2021-02-01 LAB — POCT GLYCOSYLATED HEMOGLOBIN (HGB A1C): Hemoglobin A1C: 11.9 % — AB (ref 4.0–5.6)

## 2021-02-01 MED ORDER — AMLODIPINE BESYLATE 5 MG PO TABS: 5.0000 mg | ORAL_TABLET | Freq: Every day | ORAL | 2 refills | Status: DC

## 2021-02-01 MED ORDER — HYDROCHLOROTHIAZIDE 50 MG PO TABS
50.0000 mg | ORAL_TABLET | Freq: Every day | ORAL | 2 refills | Status: DC
Start: 2021-02-01 — End: 2021-04-27

## 2021-02-01 NOTE — Progress Notes (Signed)
[facility] [place],  [address]  Internal MEDICINE  Office Visit Note  Patient Name: Alexis Holt  [id number]  [id number]  Date of Service: 02/06/2021   Complaints/HPI Pt is here for establishment of PCP. Chief Complaint  Patient presents with   New Patient (Initial Visit)    Discuss meds and diabetes    Diabetes   Anxiety   Quality Metric Gaps    Colonoscopy done 2018, pap, mammogram, shingrix   HPI Alexis Holt presents for a new patient visit to establish care.  She has a history of anxiety, diabetes, and hypothyroidism she recently had a mammogram in April 2022 that was negative.  She has records from [place] [facility] that she will have sent to the office showing that she had her colonoscopy and EGD recently and polyps were removed and determined as benign.  She lives with her husband who is a Administrator and is currently working as a Merchandiser, retail.  She has 3 grown children and 3 young grandchildren.  She is a non-smoker and denies any alcohol use or illicit drug use.  She has had a thyroidectomy due to multinodular thyroid which is why she has acquired hypothyroidism currently she is working on diet modifications to help her lose weight and control her sugars she was started on glipizide initially for her diabetes and has lost 20 pounds so far.  Her current weight is 185 pounds and her BMI is 32.91.  She denies she denies any pain except frequent headaches which she has been previously prescribed migraine medications for which do help.  She does tend to travel with her husband to see the world when he goes on long truck driving routes.  Routine labs will be ordered to establish baseline for patient.  -Patient recently had severe bronchitis in May and was in the ER twice.  She was initially given steroids which did not help and then she also received antibiotics and she is still reporting a lingering cough although overall improvement.  She states she had  bronchitis for a period of about 7 weeks. -She reports a history of recurrent tension headaches although some of her associated symptoms could be present with migraines and she is on medications that relieve migraine headaches. Her A1c was checked today and it is 11.9.  Her diabetes is not under control at this point.   Current Medication: Outpatient Encounter Medications as of 02/01/2021  Medication Sig   amLODipine (NORVASC) 5 MG tablet Take 1 tablet (5 mg total) by mouth daily.   ASPIRIN 81 PO Take by mouth.   atorvastatin (LIPITOR) 20 MG tablet Take 1 tablet by mouth daily.   cetirizine (ZYRTEC) 10 MG tablet Take 10 mg by mouth daily.   Cholecalciferol (VITAMIN D-3) 5000 UNITS TABS Take 5,000 Units by mouth 2 (two) times a week.    FLUoxetine (PROZAC) 20 MG capsule Take 20 mg by mouth daily.   glipiZIDE (GLUCOTROL XL) 10 MG 24 hr tablet Take 10 mg by mouth every morning.   hydrochlorothiazide (HYDRODIURIL) 50 MG tablet Take 1 tablet (50 mg total) by mouth daily.   levothyroxine (SYNTHROID, LEVOTHROID) 137 MCG tablet Take 137 mcg by mouth daily.   pantoprazole (PROTONIX) 40 MG tablet Take 40 mg by mouth every morning.    [DISCONTINUED] atenolol (TENORMIN) 50 MG tablet Take 50 mg by mouth daily.   [DISCONTINUED] hydrochlorothiazide (HYDRODIURIL) 50 MG tablet hydrochlorothiazide 50 mg tablet   [DISCONTINUED] benzonatate (TESSALON PERLES) 100 MG capsule Take  2 capsules (200 mg total) by mouth 3 (three) times daily as needed. (Patient not taking: Reported on 02/01/2021)   [DISCONTINUED] busPIRone (BUSPAR) 10 MG tablet Take 5 mg by mouth 2 (two) times daily. (Patient not taking: Reported on 02/01/2021)   [DISCONTINUED] fexofenadine (ALLEGRA) 60 MG tablet Take 1 tablet (60 mg total) by mouth 2 (two) times daily. (Patient not taking: Reported on 02/01/2021)   [DISCONTINUED] methylPREDNISolone (MEDROL DOSEPAK) 4 MG TBPK tablet Take Tapered dose as directed (Patient not taking: Reported on 02/01/2021)    [DISCONTINUED] metoCLOPramide (REGLAN) 10 MG tablet Take 1 tablet (10 mg total) by mouth 3 (three) times daily as needed (headache / nausea). (Patient not taking: Reported on 02/01/2021)   [DISCONTINUED] naproxen (NAPROSYN) 500 MG tablet Take 1 tablet (500 mg total) by mouth 2 (two) times daily with a meal. (Patient not taking: Reported on 02/01/2021)   No facility-administered encounter medications on file as of 02/01/2021.    Surgical History: Past Surgical History:  Procedure Laterality Date   ABDOMINAL HYSTERECTOMY     CHOLECYSTECTOMY     thyriod     surgery   TUBAL LIGATION      Medical History: Past Medical History:  Diagnosis Date   Anxiety    Chronic ear infection    Diabetes mellitus without complication (HCC)    Generalized headaches    Panic attacks    Thyroid disease    UTI (lower urinary tract infection)     Family History: Family History  Problem Relation Age of Onset   Hypertension Mother    Cancer Father    Cancer Paternal Grandmother    Cancer Paternal Grandfather     Social History   Socioeconomic History   Marital status: Married    Spouse name: Not on file   Number of children: Not on file   Years of education: Not on file   Highest education level: Not on file  Occupational History   Not on file  Tobacco Use   Smoking status: Never   Smokeless tobacco: Never  Substance and Sexual Activity   Alcohol use: No   Drug use: No   Sexual activity: Not on file  Other Topics Concern   Not on file  Social History Narrative   Not on file   Social Determinants of Health   Financial Resource Strain: Not on file  Food Insecurity: Not on file  Transportation Needs: Not on file  Physical Activity: Not on file  Stress: Not on file  Social Connections: Not on file  Intimate Partner Violence: Not on file     Review of Systems  Constitutional:  Negative for chills, fatigue and unexpected weight change.  HENT:  Negative for congestion, rhinorrhea,  sneezing and sore throat.   Eyes:  Negative for redness.  Respiratory:  Negative for cough, chest tightness and shortness of breath.   Cardiovascular:  Negative for chest pain and palpitations.  Gastrointestinal:  Negative for abdominal pain, constipation, diarrhea, nausea and vomiting.  Genitourinary:  Negative for dysuria and frequency.  Musculoskeletal:  Negative for arthralgias, back pain, joint swelling and neck pain.  Skin:  Negative for rash.  Neurological: Negative.  Negative for tremors and numbness.  Hematological:  Negative for adenopathy. Does not bruise/bleed easily.  Psychiatric/Behavioral:  Negative for behavioral problems (Depression), sleep disturbance and suicidal ideas. The patient is not nervous/anxious.    Vital Signs: BP (!) 141/77   Pulse 85   Temp 97.7 F (36.5 C)   Resp 16  Ht '5\' 3"'  (1.6 m)   Wt 185 lb 12.8 oz (84.3 kg)   SpO2 98%   BMI 32.91 kg/m    Physical Exam Vitals reviewed.  Constitutional:      General: She is not in acute distress.    Appearance: Normal appearance. She is well-developed. She is obese. She is not ill-appearing or diaphoretic.  HENT:     Head: Normocephalic and atraumatic.  Neck:     Thyroid: No thyromegaly.     Vascular: No JVD.     Trachea: No tracheal deviation.  Cardiovascular:     Rate and Rhythm: Normal rate and regular rhythm.     Pulses: Normal pulses.     Heart sounds: Normal heart sounds. No murmur heard.   No friction rub. No gallop.  Pulmonary:     Effort: Pulmonary effort is normal. No respiratory distress.     Breath sounds: Normal breath sounds. No wheezing or rales.  Chest:     Chest wall: No tenderness.  Skin:    General: Skin is warm and dry.     Capillary Refill: Capillary refill takes less than 2 seconds.  Neurological:     Mental Status: She is alert and oriented to person, place, and time.  Psychiatric:        Mood and Affect: Mood normal.        Behavior: Behavior normal.       Assessment/Plan: 1. Encounter to establish care with new doctor Patient in need of new PCP. Baseline labs and routine screenings discussed with patient.  - FLUoxetine (PROZAC) 20 MG capsule; Take 20 mg by mouth daily. - atorvastatin (LIPITOR) 20 MG tablet; Take 1 tablet by mouth daily. - cetirizine (ZYRTEC) 10 MG tablet; Take 10 mg by mouth daily. - ASPIRIN 81 PO; Take by mouth.  2. Encounter for routine laboratory testing Routine labs ordered to establish baseline. Patient also has recently resolved bronchitis infection that lasted for several weeks. She was on a steroid taper as well as antibiotics.  - TSH + free T4 - CBC with Differential/Platelet - CMP14+EGFR - Lipid Profile - Vitamin D (25 hydroxy) - B12 and Folate Panel  3. Type 2 diabetes mellitus without complication, without long-term current use of insulin (HCC) A1C today was 11.9 which is significantly elevated. No records of previous A1C levels to compare with. She is only taking glipizide for diabetes. She recently finished a course of steroid taper due to bronchitis. Now that she is feeling better, she is getting back to being active, eating appropriate foods to keep her glucose levels stable  and continuing to take the glipizide. Discussed adding a medication due to the elevated A1C, patient declined despite discussing risks of elevated A1C and glucose levels. The patient desires to work on decreasing her A1C with her glipizide, exercise and diet modifications especially now that she is off the steroid taper and feeling better. Will discuss further at next follow up visit.  - glipiZIDE (GLUCOTROL XL) 10 MG 24 hr tablet; Take 10 mg by mouth every morning. - POCT HgB A1C  4. Primary hypertension Blood pressure remains stable on current medications, refills ordered. - hydrochlorothiazide (HYDRODIURIL) 50 MG tablet; Take 1 tablet (50 mg total) by mouth daily.  Dispense: 30 tablet; Refill: 2 - amLODipine (NORVASC) 5 MG  tablet; Take 1 tablet (5 mg total) by mouth daily.  Dispense: 30 tablet; Refill: 2    General Counseling: Irean verbalizes understanding of the findings of todays visit and agrees  with plan of treatment. I have discussed any further diagnostic evaluation that may be needed or ordered today. We also reviewed her medications today. she has been encouraged to call the office with any questions or concerns that should arise related to todays visit.    Counseling:  [place] Controlled Substance Database was reviewed by me.  Orders Placed This Encounter  Procedures   TSH + free T4   CBC with Differential/Platelet   CMP14+EGFR   Lipid Profile   Vitamin D (25 hydroxy)   B12 and Folate Panel   POCT HgB A1C    Meds ordered this encounter  Medications   hydrochlorothiazide (HYDRODIURIL) 50 MG tablet    Sig: Take 1 tablet (50 mg total) by mouth daily.    Dispense:  30 tablet    Refill:  2   amLODipine (NORVASC) 5 MG tablet    Sig: Take 1 tablet (5 mg total) by mouth daily.    Dispense:  30 tablet    Refill:  2    Return in about 4 weeks (around 03/01/2021) for CPE, F/U, BP check, Niralya Ohanian PCP. Fingerstick glucose check as well  Time spent:30 Minutes  This patient was seen by Jonetta Osgood, FNP-C in collaboration with Dr. Clayborn Bigness as a part of collaborative care agreement.    Nasiir Monts R. Valetta Fuller, MSN, FNP-C Internal Medicine

## 2021-02-02 LAB — CBC WITH DIFFERENTIAL/PLATELET
Basophils Absolute: 0 10*3/uL (ref 0.0–0.2)
Basos: 0 %
EOS (ABSOLUTE): 0.1 10*3/uL (ref 0.0–0.4)
Eos: 2 %
Hematocrit: 38.8 % (ref 34.0–46.6)
Hemoglobin: 12.6 g/dL (ref 11.1–15.9)
Immature Grans (Abs): 0 10*3/uL (ref 0.0–0.1)
Immature Granulocytes: 1 %
Lymphocytes Absolute: 2.8 10*3/uL (ref 0.7–3.1)
Lymphs: 38 %
MCH: 28.4 pg (ref 26.6–33.0)
MCHC: 32.5 g/dL (ref 31.5–35.7)
MCV: 87 fL (ref 79–97)
Monocytes Absolute: 0.5 10*3/uL (ref 0.1–0.9)
Monocytes: 6 %
Neutrophils Absolute: 4.1 10*3/uL (ref 1.4–7.0)
Neutrophils: 53 %
Platelets: 308 10*3/uL (ref 150–450)
RBC: 4.44 x10E6/uL (ref 3.77–5.28)
RDW: 13.1 % (ref 11.7–15.4)
WBC: 7.6 10*3/uL (ref 3.4–10.8)

## 2021-02-02 LAB — CMP14+EGFR
ALT: 25 IU/L (ref 0–32)
AST: 18 IU/L (ref 0–40)
Albumin/Globulin Ratio: 1.3 (ref 1.2–2.2)
Albumin: 4 g/dL (ref 3.8–4.9)
Alkaline Phosphatase: 163 IU/L — ABNORMAL HIGH (ref 44–121)
BUN/Creatinine Ratio: 10 (ref 9–23)
BUN: 7 mg/dL (ref 6–24)
Bilirubin Total: 0.4 mg/dL (ref 0.0–1.2)
CO2: 25 mmol/L (ref 20–29)
Calcium: 9.4 mg/dL (ref 8.7–10.2)
Chloride: 98 mmol/L (ref 96–106)
Creatinine, Ser: 0.72 mg/dL (ref 0.57–1.00)
Globulin, Total: 3 g/dL (ref 1.5–4.5)
Glucose: 234 mg/dL — ABNORMAL HIGH (ref 65–99)
Potassium: 4 mmol/L (ref 3.5–5.2)
Sodium: 137 mmol/L (ref 134–144)
Total Protein: 7 g/dL (ref 6.0–8.5)
eGFR: 99 mL/min/{1.73_m2} (ref >59)

## 2021-02-02 LAB — LIPID PANEL
Chol/HDL Ratio: 3.5 ratio (ref 0.0–4.4)
Cholesterol, Total: 142 mg/dL (ref 100–199)
HDL: 41 mg/dL (ref >39)
LDL Chol Calc (NIH): 74 mg/dL (ref 0–99)
Triglycerides: 154 mg/dL — ABNORMAL HIGH (ref 0–149)
VLDL Cholesterol Cal: 27 mg/dL (ref 5–40)

## 2021-02-02 LAB — B12 AND FOLATE PANEL
Folate: 9.7 ng/mL (ref >3.0)
Vitamin B-12: 616 pg/mL (ref 232–1245)

## 2021-02-02 LAB — TSH+FREE T4
Free T4: 1.87 ng/dL — ABNORMAL HIGH (ref 0.82–1.77)
TSH: 0.625 u[IU]/mL (ref 0.450–4.500)

## 2021-02-02 LAB — VITAMIN D 25 HYDROXY (VIT D DEFICIENCY, FRACTURES): Vit D, 25-Hydroxy: 42.3 ng/mL (ref 30.0–100.0)

## 2021-02-10 ENCOUNTER — Other Ambulatory Visit: Payer: Self-pay

## 2021-02-10 DIAGNOSIS — Z7689 Persons encountering health services in other specified circumstances: Secondary | ICD-10-CM

## 2021-02-10 MED ORDER — FLUOXETINE HCL 20 MG PO CAPS: 20.0000 mg | ORAL_CAPSULE | Freq: Every day | ORAL | 1 refills | Status: DC

## 2021-02-10 NOTE — Telephone Encounter (Signed)
Pt called need refill for fluoxetine 20 mag as per alyssa send

## 2021-02-19 ENCOUNTER — Other Ambulatory Visit: Payer: Self-pay

## 2021-02-19 ENCOUNTER — Other Ambulatory Visit: Payer: Self-pay | Admitting: Nurse Practitioner

## 2021-02-19 DIAGNOSIS — Z7689 Persons encountering health services in other specified circumstances: Secondary | ICD-10-CM

## 2021-02-19 MED ORDER — ATORVASTATIN CALCIUM 20 MG PO TABS: 20.0000 mg | ORAL_TABLET | Freq: Every day | ORAL | 3 refills | Status: AC

## 2021-02-19 MED ORDER — EMPAGLIFLOZIN 10 MG PO TABS: 10.0000 mg | ORAL_TABLET | Freq: Every day | ORAL | 3 refills | Status: DC

## 2021-03-08 ENCOUNTER — Telehealth: Payer: Self-pay

## 2021-03-09 ENCOUNTER — Telehealth: Payer: Self-pay

## 2021-03-09 NOTE — Telephone Encounter (Signed)
Pt went to urgent care.

## 2021-03-09 NOTE — Telephone Encounter (Signed)
Patient called back stating she went to urgent care-Alexis Holt

## 2021-03-09 NOTE — Telephone Encounter (Signed)
Left vm for patient to return my call to scheduled virtual appointment for today-Toni

## 2021-03-09 NOTE — Telephone Encounter (Signed)
Called pt back to see if we could schedule a virtual visit and pt had went to urgent care due to her shaking with chills and having a fever.

## 2021-03-11 ENCOUNTER — Telehealth: Payer: Self-pay

## 2021-03-11 NOTE — Telephone Encounter (Signed)
Pt called and informed us that she was seen by urgent care on 03/09/21 and was told today 03/11/21 she was Covid positive and she has been taking zinc and other vitamins to help along with coricidin hbp, pt advised she is feeling better and her fever has gone away today.  Pt has been vaccinated with 3 covid vaccines.  Pt advised to stay hydrated and not to lay around if possible and to contact us if she starts feeling bad.

## 2021-03-12 ENCOUNTER — Other Ambulatory Visit: Payer: Self-pay | Admitting: Physician Assistant

## 2021-03-12 ENCOUNTER — Telehealth: Payer: Self-pay

## 2021-03-12 DIAGNOSIS — R519 Headache, unspecified: Secondary | ICD-10-CM

## 2021-03-12 DIAGNOSIS — R1084 Generalized abdominal pain: Secondary | ICD-10-CM

## 2021-03-12 MED ORDER — ACETAMINOPHEN-CODEINE #3 300-30 MG PO TABS: ORAL_TABLET | ORAL | 0 refills | Status: DC

## 2021-03-12 NOTE — Telephone Encounter (Signed)
Pt called [facility] that she is asking if there is anything she can do for severe Headaches and stomach pain.  She is positive for Covid as of 03/11/21.  I spoke to [facility] and she had Lauren send in tylenol #3 30 mg 1 tab PO BID for HA's and abd pain.  #20 to pt's pharmacy.  I called pt and left a VM for her to return my call and to let her know that we sent medication to her pharmacy and if she feels she is having any reactions to the medication to stop it immediately.

## 2021-03-15 ENCOUNTER — Telehealth: Payer: Self-pay

## 2021-03-15 NOTE — Telephone Encounter (Signed)
Pt called that she was tested positive on 7/19 she like to know she can go back to work she was fully vaccinated ad she don't have any symptoms that she ok to go back to work and also she can put N95  at work and also I spoke with alyssa she ok to go back to work

## 2021-03-17 ENCOUNTER — Telehealth: Payer: Self-pay

## 2021-03-17 NOTE — Telephone Encounter (Signed)
Left vm for 03/18/21 appointment screening-Toni 

## 2021-03-18 ENCOUNTER — Encounter: Payer: BLUE CROSS/BLUE SHIELD | Admitting: Nurse Practitioner

## 2021-04-11 ENCOUNTER — Other Ambulatory Visit: Payer: Self-pay | Admitting: Nurse Practitioner

## 2021-04-11 DIAGNOSIS — Z7689 Persons encountering health services in other specified circumstances: Secondary | ICD-10-CM

## 2021-04-12 ENCOUNTER — Other Ambulatory Visit: Payer: Self-pay

## 2021-04-12 ENCOUNTER — Encounter: Payer: Self-pay | Admitting: Internal Medicine

## 2021-04-12 ENCOUNTER — Ambulatory Visit (INDEPENDENT_AMBULATORY_CARE_PROVIDER_SITE_OTHER): Payer: No Typology Code available for payment source | Admitting: Internal Medicine

## 2021-04-12 VITALS — BP 104/78 | HR 85 | Temp 98.4°F | Resp 16 | Ht 63.0 in | Wt 179.6 lb

## 2021-04-12 DIAGNOSIS — E039 Hypothyroidism, unspecified: Secondary | ICD-10-CM

## 2021-04-12 DIAGNOSIS — E119 Type 2 diabetes mellitus without complications: Secondary | ICD-10-CM

## 2021-04-12 DIAGNOSIS — E782 Mixed hyperlipidemia: Secondary | ICD-10-CM | POA: Diagnosis not present

## 2021-04-12 DIAGNOSIS — R300 Dysuria: Secondary | ICD-10-CM

## 2021-04-12 DIAGNOSIS — Z0001 Encounter for general adult medical examination with abnormal findings: Secondary | ICD-10-CM

## 2021-04-12 DIAGNOSIS — F320 Major depressive disorder, single episode, mild: Secondary | ICD-10-CM

## 2021-04-12 DIAGNOSIS — I10 Essential (primary) hypertension: Secondary | ICD-10-CM | POA: Diagnosis not present

## 2021-04-12 LAB — POCT GLYCOSYLATED HEMOGLOBIN (HGB A1C): Hemoglobin A1C: 8.1 % — AB (ref 4.0–5.6)

## 2021-04-12 LAB — POCT UA - MICROALBUMIN
Albumin/Creatinine Ratio, Urine, POC: <30
Creatinine, POC: 100 mg/dL
Microalbumin Ur, POC: 10 mg/L

## 2021-04-12 MED ORDER — BISOPROLOL-HYDROCHLOROTHIAZIDE 2.5-6.25 MG PO TABS: 1.0000 | ORAL_TABLET | Freq: Every day | ORAL | 1 refills | Status: DC

## 2021-04-12 MED ORDER — EMPAGLIFLOZIN 25 MG PO TABS: 25.0000 mg | ORAL_TABLET | Freq: Every day | ORAL | 1 refills | Status: DC

## 2021-04-12 MED ORDER — ESOMEPRAZOLE MAGNESIUM 40 MG PO CPDR: 40.0000 mg | DELAYED_RELEASE_CAPSULE | Freq: Every day | ORAL | 1 refills | Status: DC

## 2021-04-12 MED ORDER — RYBELSUS 3 MG PO TABS: ORAL_TABLET | ORAL | 3 refills | Status: DC

## 2021-04-12 MED ORDER — LEVOTHYROXINE SODIUM 100 MCG PO TABS: ORAL_TABLET | ORAL | 1 refills | Status: DC

## 2021-04-12 NOTE — Progress Notes (Signed)
[facility] [place], [place] [address]  Internal MEDICINE  Office Visit Note  Patient Name: Alexis Holt  [id number]  [id number]  Date of Service: 04/12/2021  Chief Complaint  Patient presents with   Annual Exam   Quality Metric Gaps    HIV screen, Hep C screen, Pt had Hysterectomy no Pap, Tdap, Shingrix     HPI Pt is here for routine health maintenance examination.. Patient has history of diabetes for last few years, last hemoglobin A1c was 11 and Jardiance 10 mg once a day was added to Glucotrol.  She reports good glycemic control with some low blood sugars.  Her A1c is 8. Her blood pressure is well controlled however hydrochlorothiazide dose is high. She is maintained on a statin. She is planning to move to [place] next month and will like all her medication refilled patient will need to have a colonoscopy depression is well controlled  Current Medication: Outpatient Encounter Medications as of 04/12/2021  Medication Sig   amLODipine (NORVASC) 5 MG tablet Take 1 tablet (5 mg total) by mouth daily.   ASPIRIN 81 PO Take by mouth.   atorvastatin (LIPITOR) 20 MG tablet Take 1 tablet (20 mg total) by mouth daily.   bisoprolol-hydrochlorothiazide (ZIAC) 2.5-6.25 MG tablet Take 1 tablet by mouth daily.   cetirizine (ZYRTEC) 10 MG tablet TAKE 1 TABLET BY MOUTH EVERY DAY   Cholecalciferol (VITAMIN D-3) 5000 UNITS TABS Take 5,000 Units by mouth 2 (two) times a week.    empagliflozin (JARDIANCE) 25 MG TABS tablet Take 1 tablet (25 mg total) by mouth daily before breakfast.   esomeprazole (NEXIUM) 40 MG capsule Take 1 capsule (40 mg total) by mouth daily.   FLUoxetine (PROZAC) 20 MG capsule TAKE 1 CAPSULE(20 MG) BY MOUTH DAILY   hydrochlorothiazide (HYDRODIURIL) 50 MG tablet Take 1 tablet (50 mg total) by mouth daily.   Semaglutide (RYBELSUS) 3 MG TABS Take one tab po qd in am on empty stomach   [DISCONTINUED] empagliflozin (JARDIANCE) 10 MG TABS tablet Take  1 tablet (10 mg total) by mouth daily before breakfast.   [DISCONTINUED] glipiZIDE (GLUCOTROL XL) 10 MG 24 hr tablet Take 10 mg by mouth every morning.   [DISCONTINUED] levothyroxine (SYNTHROID, LEVOTHROID) 137 MCG tablet Take 137 mcg by mouth daily.   [DISCONTINUED] pantoprazole (PROTONIX) 40 MG tablet Take 40 mg by mouth every morning.    levothyroxine (SYNTHROID) 100 MCG tablet Take one tab in am on empty stomach   [DISCONTINUED] acetaminophen-codeine (TYLENOL #3) 300-30 MG tablet Take one tab po twice per day (Patient not taking: Reported on 04/12/2021)   No facility-administered encounter medications on file as of 04/12/2021.    Surgical History: Past Surgical History:  Procedure Laterality Date   ABDOMINAL HYSTERECTOMY  2006   CHOLECYSTECTOMY     thyriod     surgery   TUBAL LIGATION      Medical History: Past Medical History:  Diagnosis Date   Anxiety    Chronic ear infection    Diabetes mellitus without complication (HCC)    Generalized headaches    Panic attacks    Thyroid disease    UTI (lower urinary tract infection)     Family History: Family History  Problem Relation Age of Onset   Hypertension Mother    Cancer Father    Cancer Paternal Grandmother    Cancer Paternal Grandfather     Social History: Social History   Socioeconomic History   Marital status: Married  Spouse name: Not on file   Number of children: Not on file   Years of education: Not on file   Highest education level: Not on file  Occupational History   Not on file  Tobacco Use   Smoking status: Never   Smokeless tobacco: Never  Substance and Sexual Activity   Alcohol use: No   Drug use: No   Sexual activity: Not on file  Other Topics Concern   Not on file  Social History Narrative   Not on file   Social Determinants of Health   Financial Resource Strain: Not on file  Food Insecurity: Not on file  Transportation Needs: Not on file  Physical Activity: Not on file  Stress:  Not on file  Social Connections: Not on file      Review of Systems  Constitutional:  Negative for chills, fatigue and unexpected weight change.  HENT:  Positive for postnasal drip. Negative for congestion, rhinorrhea, sneezing and sore throat.   Eyes:  Negative for redness.  Respiratory:  Negative for cough, chest tightness and shortness of breath.   Cardiovascular:  Negative for chest pain and palpitations.  Gastrointestinal:  Negative for abdominal pain, constipation, diarrhea, nausea and vomiting.  Genitourinary:  Negative for dysuria and frequency.  Musculoskeletal:  Negative for arthralgias, back pain, joint swelling and neck pain.  Skin:  Negative for rash.  Neurological: Negative.  Negative for tremors and numbness.  Hematological:  Negative for adenopathy. Does not bruise/bleed easily.  Psychiatric/Behavioral:  Negative for behavioral problems (Depression), sleep disturbance and suicidal ideas. The patient is not nervous/anxious.     Vital Signs: BP 104/78   Pulse 85   Temp 98.4 F (36.9 C)   Resp 16   Ht '5\' 3"'  (1.6 m)   Wt 179 lb 9.6 oz (81.5 kg)   SpO2 97%   BMI 31.81 kg/m    Physical Exam Constitutional:      General: She is not in acute distress.    Appearance: She is well-developed. She is not diaphoretic.  HENT:     Head: Normocephalic and atraumatic.     Mouth/Throat:     Pharynx: No oropharyngeal exudate.  Eyes:     Pupils: Pupils are equal, round, and reactive to light.  Neck:     Thyroid: No thyromegaly.     Vascular: No JVD.     Trachea: No tracheal deviation.  Cardiovascular:     Rate and Rhythm: Normal rate and regular rhythm.     Pulses:          Dorsalis pedis pulses are 3+ on the right side and 3+ on the left side.       Posterior tibial pulses are 3+ on the right side and 3+ on the left side.     Heart sounds: Normal heart sounds. No murmur heard.   No friction rub. No gallop.  Pulmonary:     Effort: Pulmonary effort is normal. No  respiratory distress.     Breath sounds: No wheezing or rales.  Chest:     Chest wall: No tenderness.  Breasts:    Right: Normal.     Left: Normal.  Abdominal:     General: Bowel sounds are normal.     Palpations: Abdomen is soft.  Musculoskeletal:        General: Normal range of motion.     Cervical back: Normal range of motion and neck supple.     Right foot: Normal range of motion.  Left foot: Normal range of motion.  Feet:     Right foot:     Protective Sensation: 2 sites tested.  2 sites sensed.     Skin integrity: Skin integrity normal.     Toenail Condition: Right toenails are normal.     Left foot:     Protective Sensation: 2 sites tested.  2 sites sensed.     Skin integrity: Skin integrity normal.     Toenail Condition: Left toenails are normal.  Lymphadenopathy:     Cervical: No cervical adenopathy.  Skin:    General: Skin is warm and dry.  Neurological:     Mental Status: She is alert and oriented to person, place, and time.     Cranial Nerves: No cranial nerve deficit.  Psychiatric:        Behavior: Behavior normal.        Thought Content: Thought content normal.        Judgment: Judgment normal.     LABS: Recent Results (from the past 2160 hour(s))  POCT HgB A1C     Status: Abnormal   Collection Time: 02/01/21  9:17 AM  Result Value Ref Range   Hemoglobin A1C 11.9 (A) 4.0 - 5.6 %   HbA1c POC (<> result, manual entry)     HbA1c, POC (prediabetic range)     HbA1c, POC (controlled diabetic range)    TSH + free T4     Status: Abnormal   Collection Time: 02/01/21 10:29 AM  Result Value Ref Range   TSH 0.625 0.450 - 4.500 uIU/mL   Free T4 1.87 (H) 0.82 - 1.77 ng/dL  CBC with Differential/Platelet     Status: None   Collection Time: 02/01/21 10:29 AM  Result Value Ref Range   WBC 7.6 3.4 - 10.8 x10E3/uL   RBC 4.44 3.77 - 5.28 x10E6/uL   Hemoglobin 12.6 11.1 - 15.9 g/dL   Hematocrit 38.8 34.0 - 46.6 %   MCV 87 79 - 97 fL   MCH 28.4 26.6 - 33.0 pg    MCHC 32.5 31.5 - 35.7 g/dL   RDW 13.1 11.7 - 15.4 %   Platelets 308 150 - 450 x10E3/uL   Neutrophils 53 Not Estab. %   Lymphs 38 Not Estab. %   Monocytes 6 Not Estab. %   Eos 2 Not Estab. %   Basos 0 Not Estab. %   Neutrophils Absolute 4.1 1.4 - 7.0 x10E3/uL   Lymphocytes Absolute 2.8 0.7 - 3.1 x10E3/uL   Monocytes Absolute 0.5 0.1 - 0.9 x10E3/uL   EOS (ABSOLUTE) 0.1 0.0 - 0.4 x10E3/uL   Basophils Absolute 0.0 0.0 - 0.2 x10E3/uL   Immature Granulocytes 1 Not Estab. %   Immature Grans (Abs) 0.0 0.0 - 0.1 x10E3/uL  CMP14+EGFR     Status: Abnormal   Collection Time: 02/01/21 10:29 AM  Result Value Ref Range   Glucose 234 (H) 65 - 99 mg/dL   BUN 7 6 - 24 mg/dL   Creatinine, Ser 0.72 0.57 - 1.00 mg/dL   eGFR 99 >59 mL/min/1.73   BUN/Creatinine Ratio 10 9 - 23   Sodium 137 134 - 144 mmol/L   Potassium 4.0 3.5 - 5.2 mmol/L   Chloride 98 96 - 106 mmol/L   CO2 25 20 - 29 mmol/L   Calcium 9.4 8.7 - 10.2 mg/dL   Total Protein 7.0 6.0 - 8.5 g/dL   Albumin 4.0 3.8 - 4.9 g/dL   Globulin, Total 3.0 1.5 - 4.5 g/dL  Albumin/Globulin Ratio 1.3 1.2 - 2.2   Bilirubin Total 0.4 0.0 - 1.2 mg/dL   Alkaline Phosphatase 163 (H) 44 - 121 IU/L   AST 18 0 - 40 IU/L   ALT 25 0 - 32 IU/L  Lipid Profile     Status: Abnormal   Collection Time: 02/01/21 10:29 AM  Result Value Ref Range   Cholesterol, Total 142 100 - 199 mg/dL   Triglycerides 154 (H) 0 - 149 mg/dL   HDL 41 >39 mg/dL   VLDL Cholesterol Cal 27 5 - 40 mg/dL   LDL Chol Calc (NIH) 74 0 - 99 mg/dL   Chol/HDL Ratio 3.5 0.0 - 4.4 ratio    Comment:                                   T. Chol/HDL Ratio                                             Men  Women                               1/2 Avg.Risk  3.4    3.3                                   Avg.Risk  5.0    4.4                                2X Avg.Risk  9.6    7.1                                3X Avg.Risk 23.4   11.0   Vitamin D (25 hydroxy)     Status: None   Collection Time: 02/01/21  10:29 AM  Result Value Ref Range   Vit D, 25-Hydroxy 42.3 30.0 - 100.0 ng/mL    Comment: Vitamin D deficiency has been defined by the [place] practice guideline as a level of serum 25-OH vitamin D less than 20 ng/mL (1,2). The Endocrine Society went on to further define vitamin D insufficiency as a level between 21 and 29 ng/mL (2). 1. IOM (Institute of Medicine). 2010. Dietary reference    intakes for calcium and D. [place]: The    [employer]. 2. Holick MF, Binkley Heavener, Bischoff-Ferrari HA, et al.    Evaluation, treatment, and prevention of vitamin D    deficiency: an Endocrine Society clinical practice    guideline. JCEM. 2011 Jul; 96(7):1911-30.   B12 and Folate Panel     Status: None   Collection Time: 02/01/21 10:29 AM  Result Value Ref Range   Vitamin B-12 616 232 - 1,245 pg/mL   Folate 9.7 >3.0 ng/mL    Comment: A serum folate concentration of less than 3.1 ng/mL is considered to represent clinical deficiency.   POCT glycosylated hemoglobin (Hb A1C)     Status: Abnormal   Collection Time: 04/12/21 10:59 AM  Result Value Ref Range   Hemoglobin A1C 8.1 (A) 4.0 -  5.6 %   HbA1c POC (<> result, manual entry)     HbA1c, POC (prediabetic range)     HbA1c, POC (controlled diabetic range)        Assessment/Plan: 1. Encounter for general adult medical examination with abnormal findings Patient is up-to-date on her preventive health maintenance is however moving to a different will need colonoscopy as indicated  2. Type 2 diabetes mellitus without complication, without long-term current use of insulin (HCC) We will stop her Glucotrol due to events of hypoglycemia we will increase her Jardiance 25 mg once a day, and samples of Rybelsus 3 mg once a day is given.  Continue to monitor blood sugars at home - POCT UA - Microalbumin - POCT glycosylated hemoglobin (Hb A1C) 8.2 today  3. Benign hypertension Norvasc 5 mg once a  day DC hydrochlorothiazide add bisoprolol 2.5/6.25 mg once a day  4. Mixed hyperlipidemia Patient will continue to take her Lipitor as before LDL is at target  5. Hypothyroidism, unspecified type Continue Synthroid as before will need rechecking TSH and free T4  6. Dysuria - UA/M w/rflx Culture, Routine  7. Depression, major, single episode, mild ([place]) Controlled with meds    General Counseling: Deannie verbalizes understanding of the findings of todays visit and agrees with plan of treatment. I have discussed any further diagnostic evaluation that may be needed or ordered today. We also reviewed her medications today. she has been encouraged to call the office with any questions or concerns that should arise related to todays visit.   The 10-year ASCVD risk score Mikey Bussing DC Brooke Bonito., et al., 2013) is: 5.5%   Values used to calculate the score:     Age: 19 years     Sex: Female     Is Non-Hispanic African American: Yes     Diabetic: Yes     Tobacco smoker: No     Systolic Blood Pressure: 160 mmHg     Is BP treated: Yes     HDL Cholesterol: 41 mg/dL     Total Cholesterol: 142 mg/dL    Counseling:  [place] Controlled Substance Database was reviewed by me.  Orders Placed This Encounter  Procedures   UA/M w/rflx Culture, Routine   POCT UA - Microalbumin   POCT glycosylated hemoglobin (Hb A1C)    Meds ordered this encounter  Medications   esomeprazole (NEXIUM) 40 MG capsule    Sig: Take 1 capsule (40 mg total) by mouth daily.    Dispense:  90 capsule    Refill:  1   levothyroxine (SYNTHROID) 100 MCG tablet    Sig: Take one tab in am on empty stomach    Dispense:  90 tablet    Refill:  1   bisoprolol-hydrochlorothiazide (ZIAC) 2.5-6.25 MG tablet    Sig: Take 1 tablet by mouth daily.    Dispense:  90 tablet    Refill:  1   empagliflozin (JARDIANCE) 25 MG TABS tablet    Sig: Take 1 tablet (25 mg total) by mouth daily before breakfast.    Dispense:  30 tablet    Refill:  1    Semaglutide (RYBELSUS) 3 MG TABS    Sig: Take one tab po qd in am on empty stomach    Dispense:  30 tablet    Refill:  3    Total time spent:45 Minutes  Time spent includes review of chart, medications, test results, and follow up plan with the patient.     Lavera Guise, MD  Internal Medicine

## 2021-04-12 NOTE — Addendum Note (Signed)
Addended by: Lyndon Code on: 04/12/2021 08:22 PM   Modules accepted: Level of Service

## 2021-04-13 ENCOUNTER — Telehealth: Payer: Self-pay

## 2021-04-13 LAB — UA/M W/RFLX CULTURE, ROUTINE
Bilirubin, UA: NEGATIVE
Ketones, UA: NEGATIVE
Leukocytes,UA: NEGATIVE
Nitrite, UA: NEGATIVE
Protein,UA: NEGATIVE
RBC, UA: NEGATIVE
Specific Gravity, UA: >=1.03 — AB (ref 1.005–1.030)
Urobilinogen, Ur: 1 mg/dL (ref 0.2–1.0)
pH, UA: 7 (ref 5.0–7.5)

## 2021-04-13 LAB — MICROSCOPIC EXAMINATION
Bacteria, UA: NONE SEEN
Casts: NONE SEEN /lpf
RBC, Urine: NONE SEEN /hpf (ref 0–2)
WBC, UA: NONE SEEN /hpf (ref 0–5)

## 2021-04-13 NOTE — Telephone Encounter (Signed)
Pt called and stated that the RYBELSUS medication she was given on 04/12/21 by DFK was making her heart rate high and causing her to shake.  Per DFK she advised for pt to stop the [facility] and she can bring the medication back to [place] when she comes in for her follow up appt.  Pt agreed to bring samples back

## 2021-04-27 ENCOUNTER — Encounter: Payer: Self-pay | Admitting: Internal Medicine

## 2021-04-27 ENCOUNTER — Ambulatory Visit (INDEPENDENT_AMBULATORY_CARE_PROVIDER_SITE_OTHER): Payer: No Typology Code available for payment source | Admitting: Internal Medicine

## 2021-04-27 ENCOUNTER — Other Ambulatory Visit: Payer: Self-pay

## 2021-04-27 VITALS — BP 124/70 | HR 70 | Temp 98.3°F | Resp 16 | Ht 63.0 in | Wt 180.2 lb

## 2021-04-27 DIAGNOSIS — I10 Essential (primary) hypertension: Secondary | ICD-10-CM

## 2021-04-27 DIAGNOSIS — E119 Type 2 diabetes mellitus without complications: Secondary | ICD-10-CM

## 2021-04-27 NOTE — Progress Notes (Signed)
[facility] [address] [place], [place] [address]  Internal MEDICINE  Office Visit Note  Patient Name: Alexis Holt  [id number]  [id number]  Date of Service: 04/27/2021  Chief Complaint  Patient presents with   Medication Management    2 week follow up   trouble sleeping    Pt requesting something to help sleep.  Tried melatonin but doesn't work anymore    HPI Pt is here for routine follow up  -She had to stop Rybelsus due to side effects patient started shaking and having palpitations continues to take Jardiance 25 mg once a day. -Blood pressure is under better control patient was bisoprolol and Norvasc. -She is moving to [place] this month and has been under great deal of stress she has been taking melatonin at bedtime with relief of her symptoms   Current Medication: Outpatient Encounter Medications as of 04/27/2021  Medication Sig   amLODipine (NORVASC) 5 MG tablet Take 1 tablet (5 mg total) by mouth daily.   ASPIRIN 81 PO Take by mouth.   atorvastatin (LIPITOR) 20 MG tablet Take 1 tablet (20 mg total) by mouth daily.   bisoprolol-hydrochlorothiazide (ZIAC) 2.5-6.25 MG tablet Take 1 tablet by mouth daily.   cetirizine (ZYRTEC) 10 MG tablet TAKE 1 TABLET BY MOUTH EVERY DAY   Cholecalciferol (VITAMIN D-3) 5000 UNITS TABS Take 5,000 Units by mouth 2 (two) times a week.    empagliflozin (JARDIANCE) 25 MG TABS tablet Take 1 tablet (25 mg total) by mouth daily before breakfast.   esomeprazole (NEXIUM) 40 MG capsule Take 1 capsule (40 mg total) by mouth daily.   FLUoxetine (PROZAC) 20 MG capsule TAKE 1 CAPSULE(20 MG) BY MOUTH DAILY   levothyroxine (SYNTHROID) 100 MCG tablet Take one tab in am on empty stomach   [DISCONTINUED] hydrochlorothiazide (HYDRODIURIL) 50 MG tablet Take 1 tablet (50 mg total) by mouth daily.   [DISCONTINUED] Semaglutide (RYBELSUS) 3 MG TABS Take one tab po qd in am on empty stomach (Patient not taking: Reported on 04/27/2021)   No  facility-administered encounter medications on file as of 04/27/2021.    Surgical History: Past Surgical History:  Procedure Laterality Date   ABDOMINAL HYSTERECTOMY  2006   CHOLECYSTECTOMY     thyriod     surgery   TUBAL LIGATION      Medical History: Past Medical History:  Diagnosis Date   Anxiety    Chronic ear infection    Diabetes mellitus without complication (HCC)    Generalized headaches    Panic attacks    Thyroid disease    UTI (lower urinary tract infection)     Family History: Family History  Problem Relation Age of Onset   Hypertension Mother    Cancer Father    Cancer Paternal Grandmother    Cancer Paternal Grandfather     Social History   Socioeconomic History   Marital status: Married    Spouse name: Not on file   Number of children: Not on file   Years of education: Not on file   Highest education level: Not on file  Occupational History   Not on file  Tobacco Use   Smoking status: Never   Smokeless tobacco: Never  Substance and Sexual Activity   Alcohol use: No   Drug use: No   Sexual activity: Not on file  Other Topics Concern   Not on file  Social History Narrative   Not on file   Social Determinants of Health   Financial Resource  Strain: Not on file  Food Insecurity: Not on file  Transportation Needs: Not on file  Physical Activity: Not on file  Stress: Not on file  Social Connections: Not on file  Intimate Partner Violence: Not on file      Review of Systems  Constitutional:  Negative for chills, fatigue and unexpected weight change.  HENT:  Negative for congestion, postnasal drip, rhinorrhea, sneezing and sore throat.   Eyes:  Negative for redness.  Respiratory:  Negative for cough, chest tightness and shortness of breath.   Cardiovascular:  Negative for chest pain and palpitations.  Gastrointestinal:  Negative for abdominal pain, constipation, diarrhea, nausea and vomiting.  Genitourinary:  Negative for dysuria and  frequency.  Musculoskeletal:  Negative for arthralgias, back pain, joint swelling and neck pain.  Skin:  Negative for rash.  Neurological: Negative.  Negative for tremors and numbness.  Hematological:  Negative for adenopathy. Does not bruise/bleed easily.  Psychiatric/Behavioral:  Negative for behavioral problems (Depression), sleep disturbance and suicidal ideas. The patient is not nervous/anxious.    Vital Signs: BP 124/70   Pulse 70   Temp 98.3 F (36.8 C)   Resp 16   Ht 5\' 3"  (1.6 m)   Wt 180 lb 3.2 oz (81.7 kg)   SpO2 97%   BMI 31.92 kg/m    Physical Exam Constitutional:      Appearance: Normal appearance.  HENT:     Head: Normocephalic and atraumatic.     Nose: Nose normal.     Mouth/Throat:     Mouth: Mucous membranes are moist.     Pharynx: No posterior oropharyngeal erythema.  Eyes:     Extraocular Movements: Extraocular movements intact.     Pupils: Pupils are equal, round, and reactive to light.  Cardiovascular:     Pulses: Normal pulses.     Heart sounds: Normal heart sounds.  Pulmonary:     Effort: Pulmonary effort is normal.     Breath sounds: Normal breath sounds.  Neurological:     General: No focal deficit present.     Mental Status: She is alert.  Psychiatric:        Mood and Affect: Mood normal.        Behavior: Behavior normal.       Assessment/Plan: 1. Type 2 diabetes mellitus without complication, without long-term current use of insulin (HCC) DC Rybelsus due to side effects.  Continue Jardiance   2. Benign hypertension Improved BP , continue ziac and norvasc as before    General Counseling: Shaton verbalizes understanding of the findings of todays visit and agrees with plan of treatment. I have discussed any further diagnostic evaluation that may be needed or ordered today. We also reviewed her medications today. she has been encouraged to call the office with any questions or concerns that should arise related to todays  visit.       Total time spent:25 Minutes Time spent includes review of chart, medications, test results, and follow up plan with the patient.   [place] Controlled Substance Database was reviewed by me.   Dr Internal medicine

## 2021-05-02 ENCOUNTER — Other Ambulatory Visit: Payer: Self-pay | Admitting: Nurse Practitioner

## 2021-05-02 DIAGNOSIS — I10 Essential (primary) hypertension: Secondary | ICD-10-CM

## 2021-05-05 ENCOUNTER — Other Ambulatory Visit: Payer: Self-pay

## 2021-05-05 ENCOUNTER — Other Ambulatory Visit: Payer: Self-pay | Admitting: Nurse Practitioner

## 2021-05-05 DIAGNOSIS — I10 Essential (primary) hypertension: Secondary | ICD-10-CM

## 2021-05-05 MED ORDER — AMLODIPINE BESYLATE 5 MG PO TABS
5.0000 mg | ORAL_TABLET | Freq: Every day | ORAL | 1 refills | Status: DC
Start: 2021-05-05 — End: 2021-09-16

## 2021-05-05 MED ORDER — BISOPROLOL-HYDROCHLOROTHIAZIDE 2.5-6.25 MG PO TABS: 1.0000 | ORAL_TABLET | Freq: Every day | ORAL | 1 refills | Status: DC

## 2021-07-27 ENCOUNTER — Other Ambulatory Visit: Payer: Self-pay | Admitting: Internal Medicine

## 2021-08-18 ENCOUNTER — Other Ambulatory Visit: Payer: Self-pay | Admitting: Internal Medicine

## 2021-08-18 DIAGNOSIS — Z7689 Persons encountering health services in other specified circumstances: Secondary | ICD-10-CM

## 2021-08-18 NOTE — Telephone Encounter (Signed)
Pt need appt for refills  ?

## 2021-09-16 ENCOUNTER — Encounter: Payer: Self-pay | Admitting: Nurse Practitioner

## 2021-09-16 ENCOUNTER — Other Ambulatory Visit: Payer: Self-pay

## 2021-09-16 ENCOUNTER — Ambulatory Visit (INDEPENDENT_AMBULATORY_CARE_PROVIDER_SITE_OTHER): Payer: No Typology Code available for payment source | Admitting: Nurse Practitioner

## 2021-09-16 VITALS — BP 130/80 | HR 75 | Temp 98.4°F | Resp 16 | Ht 63.0 in | Wt 187.0 lb

## 2021-09-16 DIAGNOSIS — R519 Headache, unspecified: Secondary | ICD-10-CM | POA: Diagnosis not present

## 2021-09-16 DIAGNOSIS — E782 Mixed hyperlipidemia: Secondary | ICD-10-CM

## 2021-09-16 DIAGNOSIS — E119 Type 2 diabetes mellitus without complications: Secondary | ICD-10-CM

## 2021-09-16 DIAGNOSIS — Z7689 Persons encountering health services in other specified circumstances: Secondary | ICD-10-CM

## 2021-09-16 DIAGNOSIS — E039 Hypothyroidism, unspecified: Secondary | ICD-10-CM

## 2021-09-16 DIAGNOSIS — K219 Gastro-esophageal reflux disease without esophagitis: Secondary | ICD-10-CM

## 2021-09-16 DIAGNOSIS — I10 Essential (primary) hypertension: Secondary | ICD-10-CM | POA: Diagnosis not present

## 2021-09-16 DIAGNOSIS — F320 Major depressive disorder, single episode, mild: Secondary | ICD-10-CM

## 2021-09-16 LAB — POCT GLYCOSYLATED HEMOGLOBIN (HGB A1C): Hemoglobin A1C: 7.6 % — AB (ref 4.0–5.6)

## 2021-09-16 MED ORDER — BUTALBITAL-APAP-CAFFEINE 50-325-40 MG PO TABS: 1.0000 | ORAL_TABLET | Freq: Four times a day (QID) | ORAL | 0 refills | Status: AC | PRN

## 2021-09-16 MED ORDER — AMLODIPINE BESYLATE 5 MG PO TABS: 5.0000 mg | ORAL_TABLET | Freq: Every day | ORAL | 1 refills | Status: DC

## 2021-09-16 MED ORDER — ESOMEPRAZOLE MAGNESIUM 40 MG PO CPDR: 40.0000 mg | DELAYED_RELEASE_CAPSULE | Freq: Every day | ORAL | 1 refills | Status: AC

## 2021-09-16 MED ORDER — FLUOXETINE HCL 10 MG PO TABS: 20.0000 mg | ORAL_TABLET | Freq: Every day | ORAL | 2 refills | Status: DC

## 2021-09-16 MED ORDER — DAPAGLIFLOZIN PROPANEDIOL 10 MG PO TABS: 10.0000 mg | ORAL_TABLET | Freq: Every day | ORAL | 2 refills | Status: AC

## 2021-09-16 MED ORDER — BISOPROLOL-HYDROCHLOROTHIAZIDE 2.5-6.25 MG PO TABS: 1.0000 | ORAL_TABLET | Freq: Every day | ORAL | 1 refills | Status: AC

## 2021-09-16 MED ORDER — CETIRIZINE HCL 10 MG PO TABS: 10.0000 mg | ORAL_TABLET | Freq: Every day | ORAL | 0 refills | Status: AC

## 2021-09-16 NOTE — Progress Notes (Signed)
[facility] [place],  [address]  Internal MEDICINE  Office Visit Note  Patient Name: Alexis Holt  [id number]  [id number]  Date of Service: 09/16/2021  Chief Complaint  Patient presents with   Follow-up    Allergies    Medication Refill   Anxiety   Headache   Diabetes    HPI Alexis Holt presents for a follow up visit for medication refills. She moved to [place] and was waiting to establish with a new PCP down there but had not had the chance to do it yet. Her mother passed away and so she had to come back up to [place] to take care of everything. She is also staying with her daughter and helping her out because she was pregnant and the patient's granddaughter was just born and brought home a couple of days ago. She has been taking more fluoxetine and was wondering if her dose can be increased.  She is overdue to check her A1C which is 7.6 today. This is improved from august 2022 when her A1C was 8.1.  She also needs her labs repeated due to prior abnormals she wants to have checked since she has not yet established with a provider in [place].     Current Medication: Outpatient Encounter Medications as of 09/16/2021  Medication Sig   ASPIRIN 81 PO Take by mouth.   atorvastatin (LIPITOR) 20 MG tablet Take 1 tablet (20 mg total) by mouth daily.   butalbital-acetaminophen-caffeine (FIORICET) 50-325-40 MG tablet Take 1 tablet by mouth every 6 (six) hours as needed for headache.   Cholecalciferol (VITAMIN D-3) 5000 UNITS TABS Take 5,000 Units by mouth 2 (two) times a week.    dapagliflozin propanediol (FARXIGA) 10 MG TABS tablet Take 1 tablet (10 mg total) by mouth daily before breakfast.   levothyroxine (SYNTHROID) 100 MCG tablet Take one tab in am on empty stomach   [DISCONTINUED] amLODipine (NORVASC) 5 MG tablet Take 1 tablet (5 mg total) by mouth daily.   [DISCONTINUED] bisoprolol-hydrochlorothiazide (ZIAC) 2.5-6.25 MG tablet Take 1 tablet by mouth  daily.   [DISCONTINUED] cetirizine (ZYRTEC) 10 MG tablet TAKE 1 TABLET BY MOUTH EVERY DAY   [DISCONTINUED] esomeprazole (NEXIUM) 40 MG capsule Take 1 capsule (40 mg total) by mouth daily.   [DISCONTINUED] FLUoxetine (PROZAC) 10 MG tablet Take by mouth.   [DISCONTINUED] FLUoxetine (PROZAC) 20 MG capsule TAKE 1 CAPSULE(20 MG) BY MOUTH DAILY   [DISCONTINUED] JARDIANCE 25 MG TABS tablet TAKE 1 TABLET BY MOUTH DAILY BEFORE BREAKFAST   amLODipine (NORVASC) 5 MG tablet Take 1 tablet (5 mg total) by mouth daily.   bisoprolol-hydrochlorothiazide (ZIAC) 2.5-6.25 MG tablet Take 1 tablet by mouth daily.   cetirizine (ZYRTEC) 10 MG tablet Take 1 tablet (10 mg total) by mouth daily.   esomeprazole (NEXIUM) 40 MG capsule Take 1 capsule (40 mg total) by mouth daily.   FLUoxetine (PROZAC) 10 MG tablet Take 2 tablets (20 mg total) by mouth daily.   No facility-administered encounter medications on file as of 09/16/2021.    Surgical History: Past Surgical History:  Procedure Laterality Date   ABDOMINAL HYSTERECTOMY  2006   CHOLECYSTECTOMY     thyriod     surgery   TUBAL LIGATION      Medical History: Past Medical History:  Diagnosis Date   Anxiety    Chronic ear infection    Diabetes mellitus without complication (HCC)    Generalized headaches    Panic attacks    Thyroid disease  UTI (lower urinary tract infection)     Family History: Family History  Problem Relation Age of Onset   Stroke Mother    Kidney disease Mother    Hypertension Mother    Aneurysm Mother    Cancer Father    Cancer Paternal Grandmother    Cancer Paternal Grandfather     Social History   Socioeconomic History   Marital status: Married    Spouse name: Not on file   Number of children: Not on file   Years of education: Not on file   Highest education level: Not on file  Occupational History   Not on file  Tobacco Use   Smoking status: Never   Smokeless tobacco: Never  Substance and Sexual Activity    Alcohol use: No   Drug use: No   Sexual activity: Not on file  Other Topics Concern   Not on file  Social History Narrative   Not on file   Social Determinants of Health   Financial Resource Strain: Not on file  Food Insecurity: Not on file  Transportation Needs: Not on file  Physical Activity: Not on file  Stress: Not on file  Social Connections: Not on file  Intimate Partner Violence: Not on file      Review of Systems  Constitutional:  Negative for chills, fatigue and unexpected weight change.  HENT:  Negative for congestion, rhinorrhea, sneezing and sore throat.   Eyes:  Negative for redness.  Respiratory:  Negative for cough, chest tightness and shortness of breath.   Cardiovascular:  Negative for chest pain and palpitations.  Gastrointestinal:  Negative for abdominal pain, constipation, diarrhea, nausea and vomiting.  Genitourinary:  Negative for dysuria and frequency.  Musculoskeletal:  Negative for arthralgias, back pain, joint swelling and neck pain.  Skin:  Negative for rash.  Neurological: Negative.  Negative for tremors and numbness.  Hematological:  Negative for adenopathy. Does not bruise/bleed easily.  Psychiatric/Behavioral:  Negative for behavioral problems (Depression), sleep disturbance and suicidal ideas. The patient is not nervous/anxious.    Vital Signs: BP 130/80 Comment: 143/81   Pulse 75    Temp 98.4 F (36.9 C)    Resp 16    Ht '5\' 3"'  (1.6 m)    Wt 187 lb (84.8 kg)    SpO2 99%    BMI 33.13 kg/m    Physical Exam Vitals reviewed.  Constitutional:      General: She is not in acute distress.    Appearance: Normal appearance. She is obese. She is not ill-appearing.  HENT:     Head: Normocephalic and atraumatic.  Eyes:     Pupils: Pupils are equal, round, and reactive to light.  Cardiovascular:     Rate and Rhythm: Normal rate and regular rhythm.  Pulmonary:     Effort: Pulmonary effort is normal. No respiratory distress.  Neurological:      Mental Status: She is alert and oriented to person, place, and time.  Psychiatric:        Mood and Affect: Mood normal.        Behavior: Behavior normal.       Assessment/Plan: 1. Type 2 diabetes mellitus without complication, without long-term current use of insulin (HCC) A1C continues to improve, need to repeat A1C in 3 months. Refills and routine labs ordered.  - POCT HgB A1C - dapagliflozin propanediol (FARXIGA) 10 MG TABS tablet; Take 1 tablet (10 mg total) by mouth daily before breakfast.  Dispense: 30 tablet; Refill: 2 -  CMP14+EGFR - CBC with Differential/Platelet  2. Primary hypertension Routine labs ordered, medications refilled, BP stable.  - CMP14+EGFR - CBC with Differential/Platelet - bisoprolol-hydrochlorothiazide (ZIAC) 2.5-6.25 MG tablet; Take 1 tablet by mouth daily.  Dispense: 90 tablet; Refill: 1 - amLODipine (NORVASC) 5 MG tablet; Take 1 tablet (5 mg total) by mouth daily.  Dispense: 90 tablet; Refill: 1  3. Generalized headaches Refills ordered, fioricet prescribed for headaches  - cetirizine (ZYRTEC) 10 MG tablet; Take 1 tablet (10 mg total) by mouth daily.  Dispense: 90 tablet; Refill: 0 - butalbital-acetaminophen-caffeine (FIORICET) 50-325-40 MG tablet; Take 1 tablet by mouth every 6 (six) hours as needed for headache.  Dispense: 14 tablet; Refill: 0  4. Mixed hyperlipidemia Routine labs ordered.  - CMP14+EGFR - Lipid Profile - CBC with Differential/Platelet  5. Hypothyroidism, unspecified type Routine labs ordered.  - CMP14+EGFR - TSH + free T4 - CBC with Differential/Platelet  6. Gastroesophageal reflux disease without esophagitis Refills ordered.  - esomeprazole (NEXIUM) 40 MG capsule; Take 1 capsule (40 mg total) by mouth daily.  Dispense: 90 capsule; Refill: 1  7. Depression, major, single episode, mild (HCC) Fluoxetine dose increased.  - FLUoxetine (PROZAC) 10 MG tablet; Take 2 tablets (20 mg total) by mouth daily.  Dispense: 60 tablet;  Refill: 2   General Counseling: Akirra verbalizes understanding of the findings of todays visit and agrees with plan of treatment. I have discussed any further diagnostic evaluation that may be needed or ordered today. We also reviewed her medications today. she has been encouraged to call the office with any questions or concerns that should arise related to todays visit.    Orders Placed This Encounter  Procedures   CMP14+EGFR   Lipid Profile   TSH + free T4   CBC with Differential/Platelet   POCT HgB A1C    Meds ordered this encounter  Medications   dapagliflozin propanediol (FARXIGA) 10 MG TABS tablet    Sig: Take 1 tablet (10 mg total) by mouth daily before breakfast.    Dispense:  30 tablet    Refill:  2   bisoprolol-hydrochlorothiazide (ZIAC) 2.5-6.25 MG tablet    Sig: Take 1 tablet by mouth daily.    Dispense:  90 tablet    Refill:  1   FLUoxetine (PROZAC) 10 MG tablet    Sig: Take 2 tablets (20 mg total) by mouth daily.    Dispense:  60 tablet    Refill:  2   cetirizine (ZYRTEC) 10 MG tablet    Sig: Take 1 tablet (10 mg total) by mouth daily.    Dispense:  90 tablet    Refill:  0   esomeprazole (NEXIUM) 40 MG capsule    Sig: Take 1 capsule (40 mg total) by mouth daily.    Dispense:  90 capsule    Refill:  1   amLODipine (NORVASC) 5 MG tablet    Sig: Take 1 tablet (5 mg total) by mouth daily.    Dispense:  90 tablet    Refill:  1   butalbital-acetaminophen-caffeine (FIORICET) 50-325-40 MG tablet    Sig: Take 1 tablet by mouth every 6 (six) hours as needed for headache.    Dispense:  14 tablet    Refill:  0    Return in about 3 weeks (around 10/07/2021) for F/U, labs, cancel physical on 2/14, patient will have that in [place].   Total time spent:30 Minutes Time spent includes review of chart, medications, test results, and follow up plan with  the patient.   [place] Controlled Substance Database was reviewed by me.  This patient was seen by Jonetta Osgood,  FNP-C in collaboration with Dr. Clayborn Bigness as a part of collaborative care agreement.   Rinoa Garramone R. Valetta Fuller, MSN, FNP-C Internal medicine

## 2021-09-17 LAB — CMP14+EGFR
ALT: 27 IU/L (ref 0–32)
AST: 24 IU/L (ref 0–40)
Albumin/Globulin Ratio: 1.7 (ref 1.2–2.2)
Albumin: 4.5 g/dL (ref 3.8–4.9)
Alkaline Phosphatase: 145 IU/L — ABNORMAL HIGH (ref 44–121)
BUN/Creatinine Ratio: 21 (ref 9–23)
BUN: 15 mg/dL (ref 6–24)
Bilirubin Total: 0.4 mg/dL (ref 0.0–1.2)
CO2: 27 mmol/L (ref 20–29)
Calcium: 9.4 mg/dL (ref 8.7–10.2)
Chloride: 98 mmol/L (ref 96–106)
Creatinine, Ser: 0.72 mg/dL (ref 0.57–1.00)
Globulin, Total: 2.6 g/dL (ref 1.5–4.5)
Glucose: 120 mg/dL — ABNORMAL HIGH (ref 70–99)
Potassium: 4.3 mmol/L (ref 3.5–5.2)
Sodium: 135 mmol/L (ref 134–144)
Total Protein: 7.1 g/dL (ref 6.0–8.5)
eGFR: 98 mL/min/{1.73_m2} (ref >59)

## 2021-09-17 LAB — LIPID PANEL
Chol/HDL Ratio: 2.8 ratio (ref 0.0–4.4)
Cholesterol, Total: 156 mg/dL (ref 100–199)
HDL: 56 mg/dL (ref >39)
LDL Chol Calc (NIH): 81 mg/dL (ref 0–99)
Triglycerides: 103 mg/dL (ref 0–149)
VLDL Cholesterol Cal: 19 mg/dL (ref 5–40)

## 2021-09-17 LAB — CBC WITH DIFFERENTIAL/PLATELET
Basophils Absolute: 0 10*3/uL (ref 0.0–0.2)
Basos: 1 %
EOS (ABSOLUTE): 0.3 10*3/uL (ref 0.0–0.4)
Eos: 4 %
Hematocrit: 38.3 % (ref 34.0–46.6)
Hemoglobin: 12.5 g/dL (ref 11.1–15.9)
Immature Grans (Abs): 0 10*3/uL (ref 0.0–0.1)
Immature Granulocytes: 0 %
Lymphocytes Absolute: 3.3 10*3/uL — ABNORMAL HIGH (ref 0.7–3.1)
Lymphs: 48 %
MCH: 28.2 pg (ref 26.6–33.0)
MCHC: 32.6 g/dL (ref 31.5–35.7)
MCV: 86 fL (ref 79–97)
Monocytes Absolute: 0.5 10*3/uL (ref 0.1–0.9)
Monocytes: 7 %
Neutrophils Absolute: 2.8 10*3/uL (ref 1.4–7.0)
Neutrophils: 40 %
Platelets: 304 10*3/uL (ref 150–450)
RBC: 4.44 x10E6/uL (ref 3.77–5.28)
RDW: 13.6 % (ref 11.7–15.4)
WBC: 6.9 10*3/uL (ref 3.4–10.8)

## 2021-09-17 LAB — TSH+FREE T4
Free T4: 1.69 ng/dL (ref 0.82–1.77)
TSH: 2.42 u[IU]/mL (ref 0.450–4.500)

## 2021-09-20 ENCOUNTER — Telehealth: Payer: Self-pay

## 2021-09-20 NOTE — Progress Notes (Signed)
Please call patient with results: -Her A1C is slightly improved.  -her metabolic panel is grossly normal except for slightly elevated glucose which is expected and her alkaline phosphatase is elevated. Her kidney function is normal.  -CBC is normal, lymphocytes absolute are slightly elevated but this can occur if there is any inflammation in the body. This number is not concerning.  -lipid panel is normal, triglycerides have improved.  -thyroid levels are normal.

## 2021-09-20 NOTE — Telephone Encounter (Signed)
-----   Message from Sallyanne Kuster, NP sent at 09/20/2021  8:11 AM EST ----- Please call patient with results: -Her A1C is slightly improved.  -her metabolic panel is grossly normal except for slightly elevated glucose which is expected and her alkaline phosphatase is elevated. Her kidney function is normal.  -CBC is normal, lymphocytes absolute are slightly elevated but this can occur if there is any inflammation in the body. This number is not concerning.  -lipid panel is normal, triglycerides have improved.  -thyroid levels are normal.

## 2021-10-05 ENCOUNTER — Encounter: Payer: No Typology Code available for payment source | Admitting: Internal Medicine

## 2021-10-06 ENCOUNTER — Ambulatory Visit: Payer: No Typology Code available for payment source | Admitting: Nurse Practitioner

## 2021-10-06 DIAGNOSIS — Z0289 Encounter for other administrative examinations: Secondary | ICD-10-CM

## 2021-10-10 ENCOUNTER — Encounter: Payer: Self-pay | Admitting: Nurse Practitioner

## 2021-10-27 ENCOUNTER — Other Ambulatory Visit: Payer: Self-pay | Admitting: Internal Medicine

## 2021-12-28 ENCOUNTER — Other Ambulatory Visit: Payer: Self-pay | Admitting: Nurse Practitioner

## 2021-12-28 DIAGNOSIS — F320 Major depressive disorder, single episode, mild: Secondary | ICD-10-CM

## 2022-02-25 ENCOUNTER — Other Ambulatory Visit: Payer: Self-pay | Admitting: Nurse Practitioner

## 2022-02-25 DIAGNOSIS — R519 Headache, unspecified: Secondary | ICD-10-CM

## 2022-04-04 ENCOUNTER — Encounter: Payer: No Typology Code available for payment source | Admitting: Internal Medicine

## 2022-05-04 ENCOUNTER — Other Ambulatory Visit: Payer: Self-pay | Admitting: Nurse Practitioner

## 2022-05-05 NOTE — Telephone Encounter (Signed)
LMOM for pt to call back to let us know if she is still a patient here

## 2022-05-11 ENCOUNTER — Other Ambulatory Visit: Payer: Self-pay | Admitting: Nurse Practitioner

## 2022-05-11 DIAGNOSIS — I10 Essential (primary) hypertension: Secondary | ICD-10-CM

## 2022-05-18 ENCOUNTER — Other Ambulatory Visit: Payer: Self-pay | Admitting: Nurse Practitioner

## 2022-05-18 DIAGNOSIS — I10 Essential (primary) hypertension: Secondary | ICD-10-CM

## 2022-09-03 IMAGING — CR DG CHEST 2V
2 series · 2 of 2 positions shown · non-contrast
Comparison: None.

CLINICAL DATA: Flu like symptoms. Productive cough, congestion and
drainage.

EXAM:
CHEST - 2 VIEW

[chest pa]
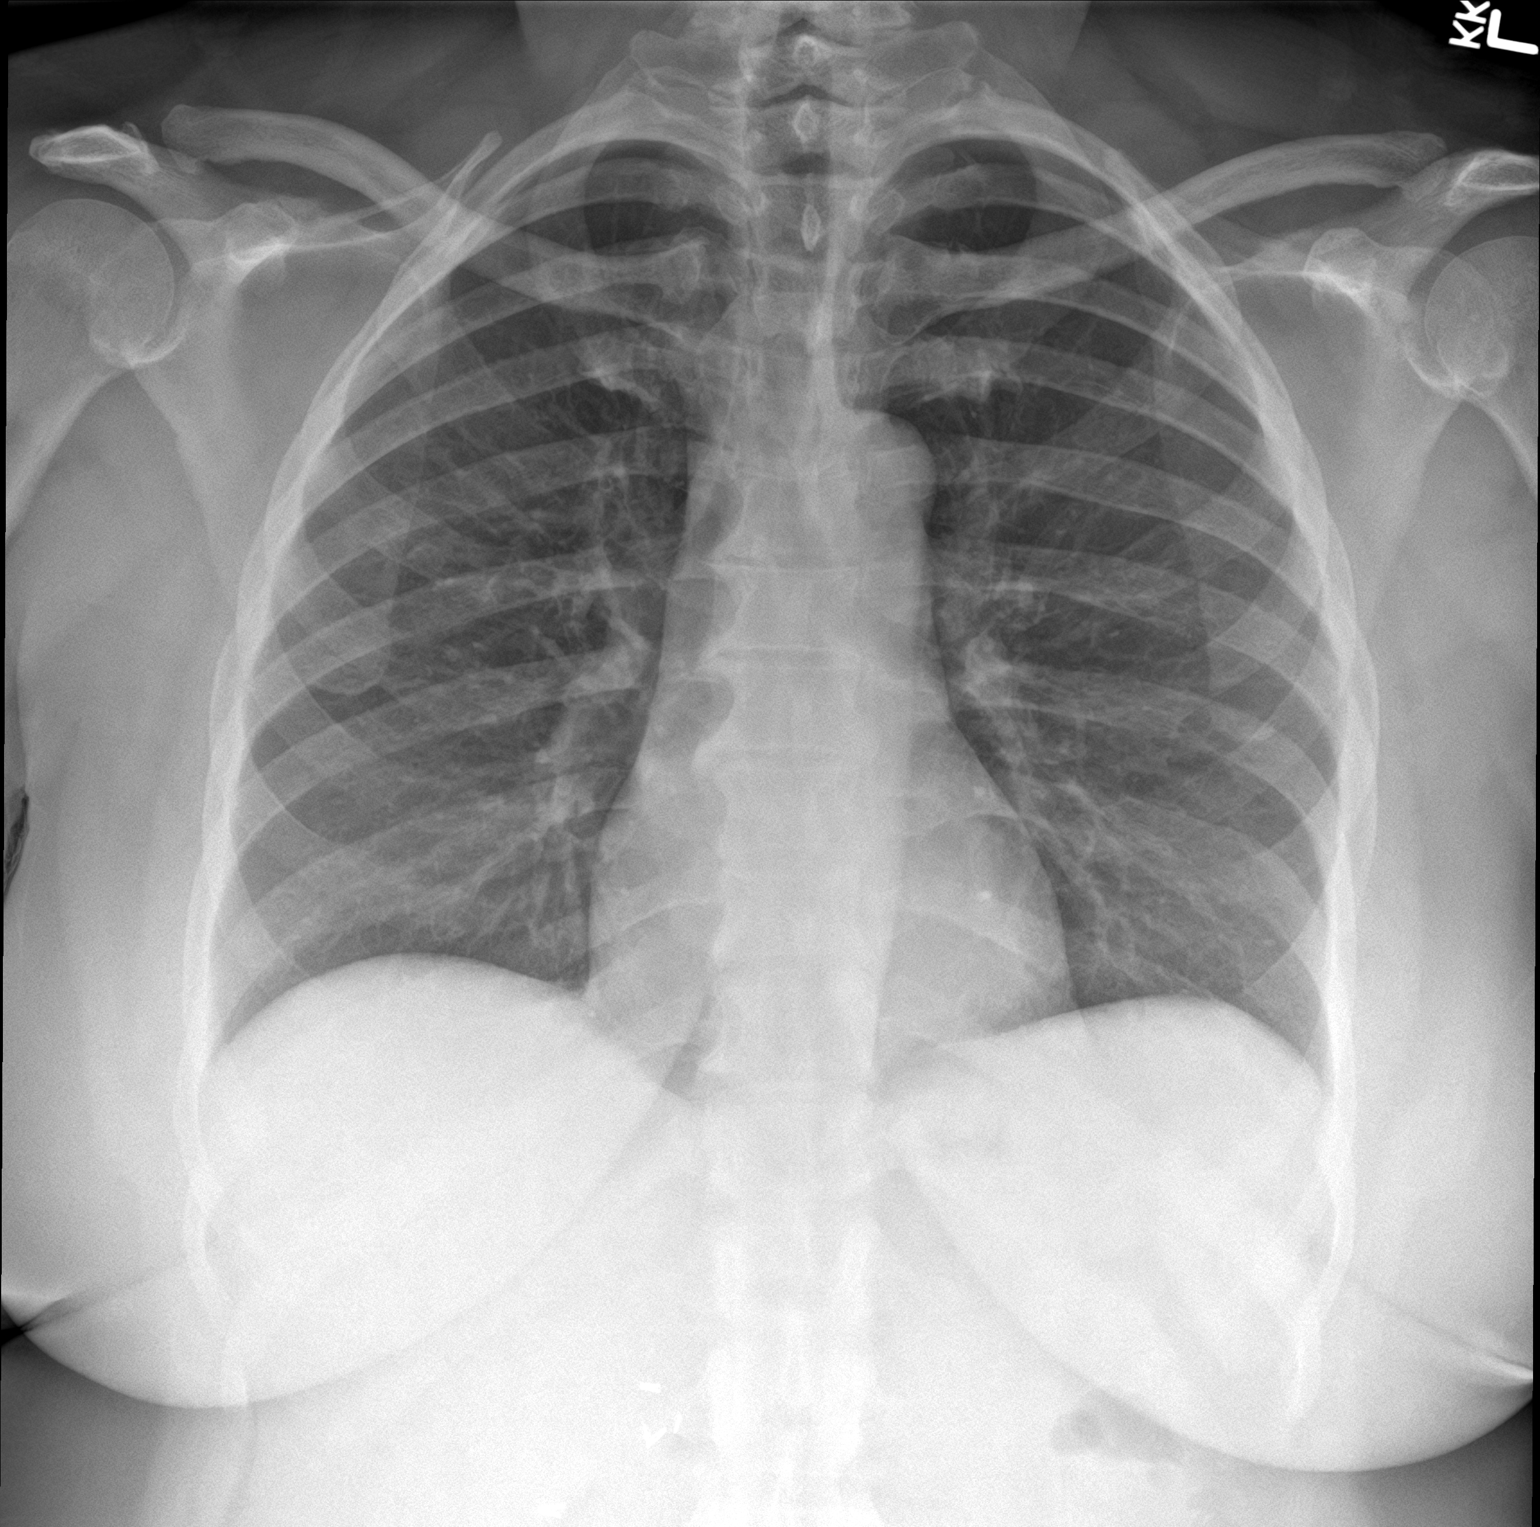

[chest lat]
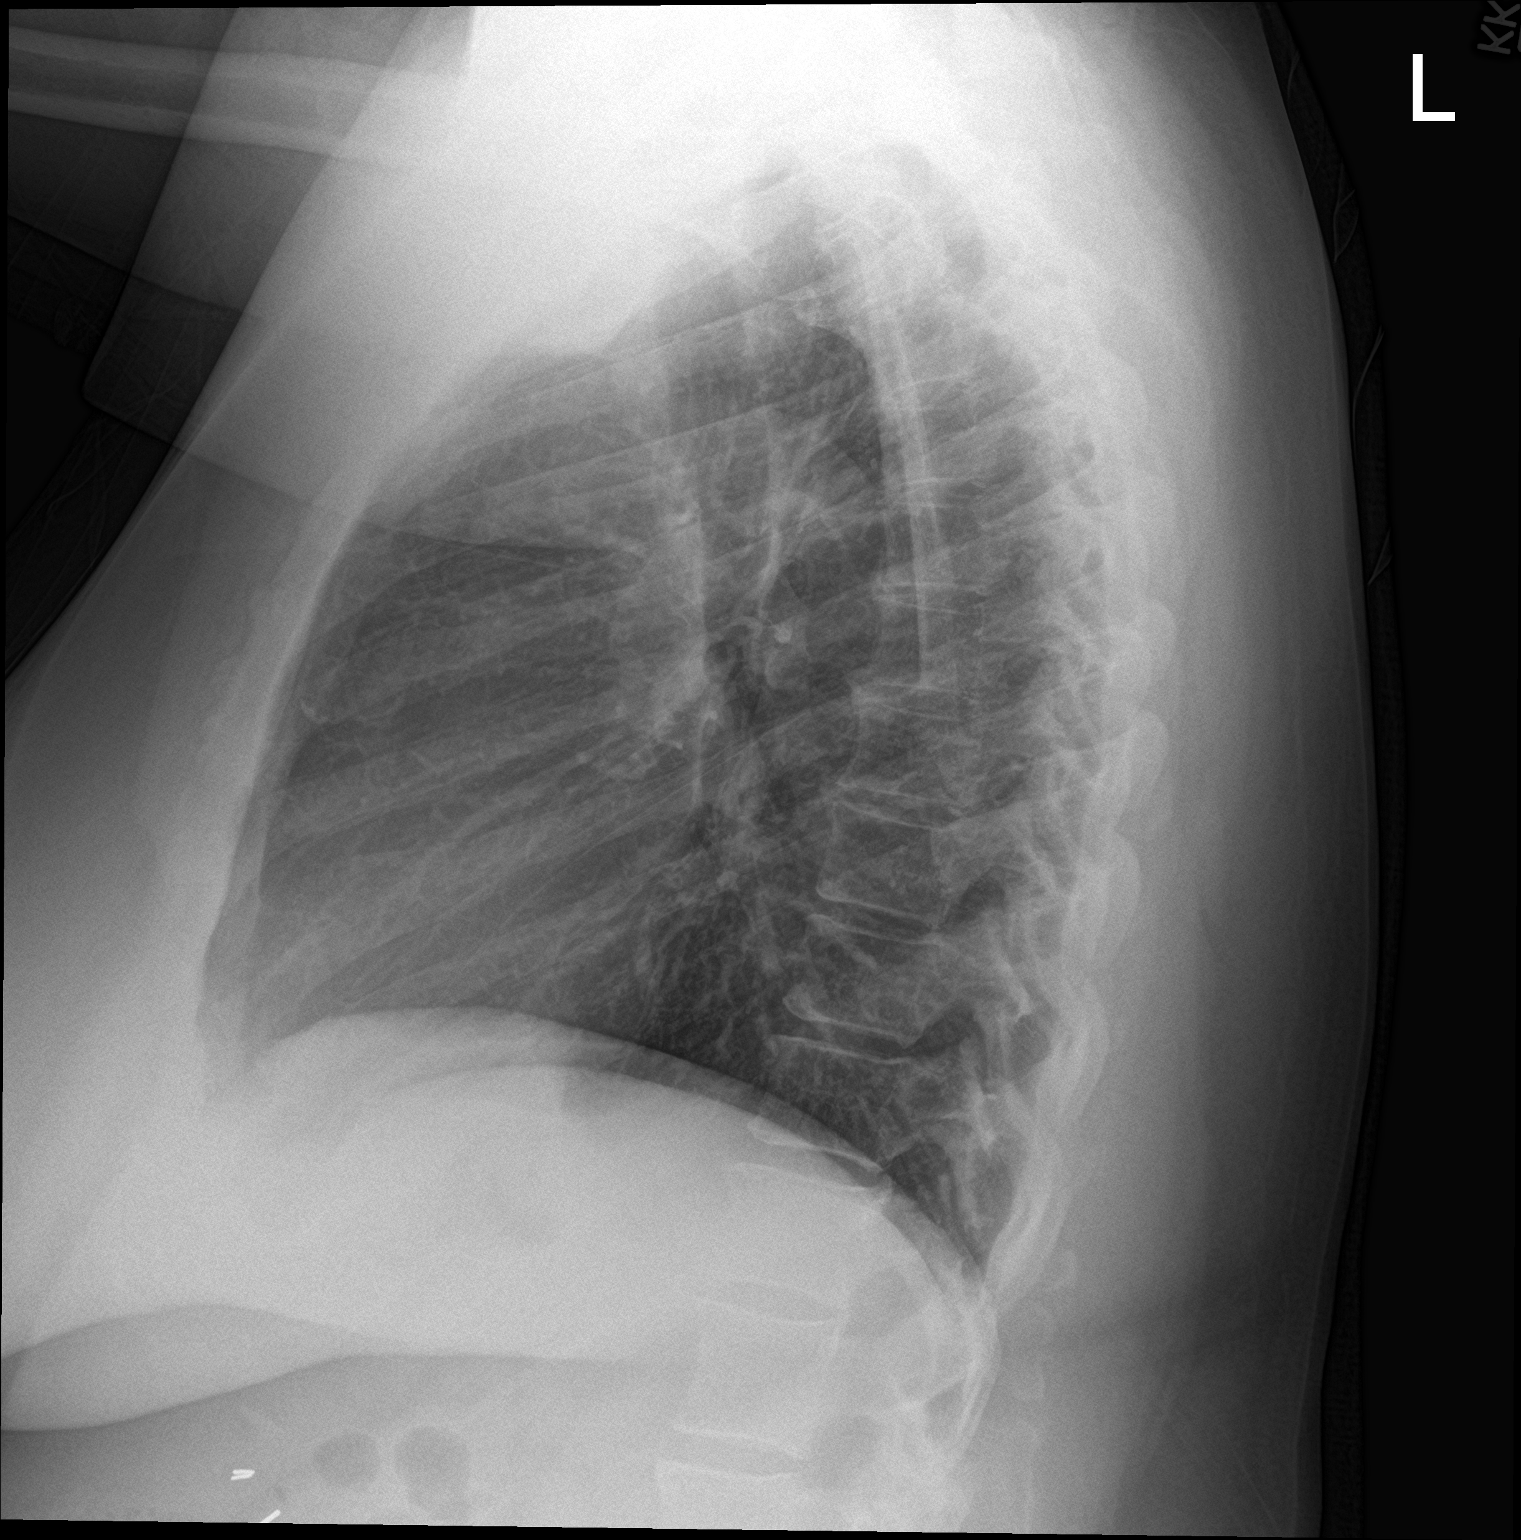

[2 of 2 positions shown; findings below may reference images not displayed]

FINDINGS: Heart size and mediastinal contours are within normal limits. Lungs
are clear. No pleural effusion. Osseous structures about the chest
are unremarkable.
IMPRESSION: No active cardiopulmonary disease.  No evidence of pneumonia.
